# Patient Record
Sex: Female | Born: 1955
Health system: Southern US, Community
[De-identification: ages and names within clinical notes are randomized; demographics above are authoritative.]

## PROBLEM LIST (undated history)

## (undated) DIAGNOSIS — H269 Unspecified cataract: Secondary | ICD-10-CM

## (undated) DIAGNOSIS — I10 Essential (primary) hypertension: Secondary | ICD-10-CM

## (undated) DIAGNOSIS — K219 Gastro-esophageal reflux disease without esophagitis: Secondary | ICD-10-CM

## (undated) DIAGNOSIS — N920 Excessive and frequent menstruation with regular cycle: Secondary | ICD-10-CM

## (undated) DIAGNOSIS — E785 Hyperlipidemia, unspecified: Secondary | ICD-10-CM

## (undated) DIAGNOSIS — R011 Cardiac murmur, unspecified: Secondary | ICD-10-CM

## (undated) DIAGNOSIS — G7102 Facioscapulohumeral muscular dystrophy: Secondary | ICD-10-CM

## (undated) HISTORY — DX: Unspecified cataract: H26.9

## (undated) HISTORY — DX: Hyperlipidemia, unspecified: E78.5

## (undated) HISTORY — DX: Cardiac murmur, unspecified: R01.1

## (undated) HISTORY — DX: Facioscapulohumeral muscular dystrophy: G71.02

## (undated) HISTORY — DX: Gastro-esophageal reflux disease without esophagitis: K21.9

## (undated) HISTORY — PX: LASIK: SHX215

## (undated) HISTORY — DX: Excessive and frequent menstruation with regular cycle: N92.0

## (undated) HISTORY — PX: CATARACT EXTRACTION: SUR2

## (undated) HISTORY — PX: LASER ABLATION CONDYLOMA CERVICAL / VULVAR: SUR819

## (undated) HISTORY — PX: POLYPECTOMY: SHX149

## (undated) HISTORY — DX: Essential (primary) hypertension: I10

---

## 1998-10-30 ENCOUNTER — Other Ambulatory Visit: Admission: RE | Admit: 1998-10-30 | Discharge: 1998-10-30 | Payer: Self-pay | Admitting: Obstetrics and Gynecology

## 1999-11-18 ENCOUNTER — Other Ambulatory Visit: Admission: RE | Admit: 1999-11-18 | Discharge: 1999-11-18 | Payer: Self-pay | Admitting: Obstetrics and Gynecology

## 2000-11-18 ENCOUNTER — Encounter: Payer: Self-pay | Admitting: Obstetrics and Gynecology

## 2000-11-18 ENCOUNTER — Encounter: Admission: RE | Admit: 2000-11-18 | Discharge: 2000-11-18 | Payer: Self-pay | Admitting: Obstetrics and Gynecology

## 2001-02-03 ENCOUNTER — Other Ambulatory Visit: Admission: RE | Admit: 2001-02-03 | Discharge: 2001-02-03 | Payer: Self-pay | Admitting: Obstetrics and Gynecology

## 2002-02-09 ENCOUNTER — Other Ambulatory Visit: Admission: RE | Admit: 2002-02-09 | Discharge: 2002-02-09 | Payer: Self-pay | Admitting: Obstetrics and Gynecology

## 2004-05-06 ENCOUNTER — Encounter: Admission: RE | Admit: 2004-05-06 | Discharge: 2004-06-10 | Payer: Self-pay | Admitting: Internal Medicine

## 2004-06-23 ENCOUNTER — Encounter: Admission: RE | Admit: 2004-06-23 | Discharge: 2004-08-31 | Payer: Self-pay | Admitting: Orthopaedic Surgery

## 2006-03-22 HISTORY — PX: COLONOSCOPY: SHX174

## 2006-07-08 ENCOUNTER — Ambulatory Visit: Payer: Self-pay | Admitting: Internal Medicine

## 2006-07-15 ENCOUNTER — Encounter: Admission: RE | Admit: 2006-07-15 | Discharge: 2006-07-15 | Payer: Self-pay | Admitting: Obstetrics and Gynecology

## 2006-07-15 ENCOUNTER — Ambulatory Visit: Payer: Self-pay | Admitting: Internal Medicine

## 2006-07-15 LAB — CONVERTED CEMR LAB
ALT: 17 units/L (ref 0–40)
AST: 19 units/L (ref 0–37)
Alkaline Phosphatase: 48 units/L (ref 39–117)
CO2: 27 meq/L (ref 19–32)
Calcium: 8.8 mg/dL (ref 8.4–10.5)
Chloride: 110 meq/L (ref 96–112)
Free T4: 0.8 ng/dL (ref 0.6–1.6)
GFR calc non Af Amer: 94 mL/min
Glucose, Bld: 94 mg/dL (ref 70–99)
Hemoglobin: 8.6 g/dL — ABNORMAL LOW (ref 12.0–15.0)
Lymphocytes Relative: 25.9 % (ref 12.0–46.0)
MCV: 67.7 fL — ABNORMAL LOW (ref 78.0–100.0)
Monocytes Absolute: 0.7 10*3/uL (ref 0.2–0.7)
Neutrophils Relative %: 61.6 % (ref 43.0–77.0)
Platelets: 367 10*3/uL (ref 150–400)
Potassium: 4.1 meq/L (ref 3.5–5.1)
RDW: 15.7 % — ABNORMAL HIGH (ref 11.5–14.6)
Sodium: 140 meq/L (ref 135–145)
TSH: 1.73 microintl units/mL (ref 0.35–5.50)
Total Bilirubin: 0.6 mg/dL (ref 0.3–1.2)
WBC: 7 10*3/uL (ref 4.5–10.5)

## 2006-07-20 ENCOUNTER — Ambulatory Visit: Payer: Self-pay | Admitting: Internal Medicine

## 2006-07-20 LAB — CONVERTED CEMR LAB
Basophils Absolute: 0 10*3/uL (ref 0.0–0.1)
Basophils Relative: 0 % (ref 0.0–1.0)
Eosinophils Absolute: 0.2 10*3/uL (ref 0.0–0.6)
Folate: >20 ng/mL
HCT: 25.4 % — ABNORMAL LOW (ref 36.0–46.0)
Lymphocytes Relative: 27 % (ref 12.0–46.0)
MCV: 68.8 fL — ABNORMAL LOW (ref 78.0–100.0)
Monocytes Absolute: 0.3 10*3/uL (ref 0.2–0.7)
RDW: 16.7 % — ABNORMAL HIGH (ref 11.5–14.6)
WBC: 7.1 10*3/uL (ref 4.5–10.5)

## 2006-07-25 ENCOUNTER — Ambulatory Visit: Payer: Self-pay | Admitting: Internal Medicine

## 2006-07-25 LAB — CONVERTED CEMR LAB
HCT: 27.3 % — ABNORMAL LOW (ref 36.0–46.0)
Hemoglobin: 8.6 g/dL — ABNORMAL LOW (ref 12.0–15.0)
Lymphocytes Relative: 28.3 % (ref 12.0–46.0)
Monocytes Absolute: 0.5 10*3/uL (ref 0.2–0.7)
Platelets: 353 10*3/uL (ref 150–400)
RBC: 3.84 M/uL — ABNORMAL LOW (ref 3.87–5.11)
RDW: 17.9 % — ABNORMAL HIGH (ref 11.5–14.6)

## 2006-08-03 ENCOUNTER — Ambulatory Visit: Payer: Self-pay | Admitting: Internal Medicine

## 2006-08-10 ENCOUNTER — Ambulatory Visit: Payer: Self-pay | Admitting: Gastroenterology

## 2006-08-23 ENCOUNTER — Ambulatory Visit: Payer: Self-pay | Admitting: Gastroenterology

## 2006-08-23 ENCOUNTER — Encounter: Payer: Self-pay | Admitting: Gastroenterology

## 2006-08-23 ENCOUNTER — Encounter: Payer: Self-pay | Admitting: Internal Medicine

## 2006-10-27 ENCOUNTER — Ambulatory Visit (HOSPITAL_COMMUNITY): Admission: RE | Admit: 2006-10-27 | Discharge: 2006-10-27 | Payer: Self-pay | Admitting: Obstetrics and Gynecology

## 2006-10-27 ENCOUNTER — Encounter (INDEPENDENT_AMBULATORY_CARE_PROVIDER_SITE_OTHER): Payer: Self-pay | Admitting: Obstetrics and Gynecology

## 2009-03-22 HISTORY — PX: LUMBAR DISC SURGERY: SHX700

## 2009-05-27 ENCOUNTER — Ambulatory Visit: Payer: Self-pay | Admitting: Internal Medicine

## 2009-05-27 DIAGNOSIS — M5416 Radiculopathy, lumbar region: Secondary | ICD-10-CM | POA: Insufficient documentation

## 2009-05-27 DIAGNOSIS — M21372 Foot drop, left foot: Secondary | ICD-10-CM

## 2009-05-27 LAB — CONVERTED CEMR LAB
Bilirubin Urine: NEGATIVE
Blood in Urine, dipstick: NEGATIVE
Nitrite: NEGATIVE
Protein, U semiquant: NEGATIVE
Urobilinogen, UA: 0.2
WBC Urine, dipstick: NEGATIVE
pH: 7

## 2009-05-28 ENCOUNTER — Ambulatory Visit: Payer: Self-pay | Admitting: Internal Medicine

## 2009-06-26 ENCOUNTER — Encounter
Admission: RE | Admit: 2009-06-26 | Discharge: 2009-06-26 | Payer: Self-pay | Admitting: Physical Medicine and Rehabilitation

## 2009-07-03 ENCOUNTER — Encounter: Payer: Self-pay | Admitting: Internal Medicine

## 2009-08-20 ENCOUNTER — Encounter (INDEPENDENT_AMBULATORY_CARE_PROVIDER_SITE_OTHER): Payer: Self-pay | Admitting: Orthopedic Surgery

## 2009-08-20 ENCOUNTER — Observation Stay (HOSPITAL_COMMUNITY): Admission: RE | Admit: 2009-08-20 | Discharge: 2009-08-23 | Payer: Self-pay | Admitting: Orthopedic Surgery

## 2009-09-03 ENCOUNTER — Encounter: Payer: Self-pay | Admitting: Internal Medicine

## 2009-09-23 ENCOUNTER — Encounter: Admission: RE | Admit: 2009-09-23 | Discharge: 2009-09-23 | Payer: Self-pay | Admitting: Obstetrics and Gynecology

## 2009-11-03 ENCOUNTER — Encounter: Payer: Self-pay | Admitting: Internal Medicine

## 2009-12-02 ENCOUNTER — Encounter
Admission: RE | Admit: 2009-12-02 | Discharge: 2010-03-02 | Payer: Self-pay | Source: Home / Self Care | Attending: Orthopedic Surgery | Admitting: Orthopedic Surgery

## 2009-12-25 ENCOUNTER — Encounter: Payer: Self-pay | Admitting: Internal Medicine

## 2010-03-02 ENCOUNTER — Encounter
Admission: RE | Admit: 2010-03-02 | Discharge: 2010-03-19 | Payer: Self-pay | Source: Home / Self Care | Attending: Orthopedic Surgery | Admitting: Orthopedic Surgery

## 2010-03-17 ENCOUNTER — Encounter: Payer: Self-pay | Admitting: Internal Medicine

## 2010-03-19 ENCOUNTER — Encounter
Admission: RE | Admit: 2010-03-19 | Discharge: 2010-04-21 | Payer: Self-pay | Source: Home / Self Care | Attending: Orthopedic Surgery | Admitting: Orthopedic Surgery

## 2010-04-21 NOTE — Assessment & Plan Note (Signed)
Summary: BACKPAIN/KDC   Vital Signs:  Patient profile:   55 year old female Weight:      204 pounds Temp:     98.4 degrees F oral Resp:     15 per minute BP sitting:   124 / 72  (left arm)  Vitals Entered By: Doristine Devoid (May 27, 2009 3:20 PM) CC: L lower back pain x2 wks now radiating down hip and some tingling in foot   CC:  L lower back pain x2 wks now radiating down hip and some tingling in foot.  History of Present Illness: Onset 2 weeks ago w/o injury as constant  but  progressive , jabbing  L LS pain radiating to  L hip.This has caused her to  alter  gait ;with gait change she has developed pain in L shin & calf. Rx: NSAIDS help. PMH of chronic, intermittent R hip pain. PMH of  shoulder impingement due to bone spur L  as per Dr Rayburn Ma.  Steroid injection X 1 in 2008; surgery had been recommended but deferred by patient.  Review of Systems General:  Complains of fatigue; denies chills, fever, sweats, and weight loss. GI:  Complains of indigestion; denies bloody stools and dark tarry stools; Dyspepsia with NSAIDS for 2-3 days. GU:  Denies discharge, dysuria, and hematuria. MS:  Complains of joint pain, low back pain, and muscle weakness; denies joint redness, joint swelling, mid back pain, and thoracic pain; Shoulder arthritis with weakness LUE. Derm:  Denies lesion(s) and rash. Neuro:  Complains of numbness and tingling; denies brief paralysis and falling down; N&T L foot .  Physical Exam  General:  well-nourished,in no acute distress but mildly uncomfortable-appearing.   Eyes:  No corneal or conjunctival inflammation noted. Perrla.No icterus Abdomen:  Bowel sounds positive,abdomen soft and non-tender without masses, organomegaly or hernias noted. Msk:  Classic" low back crawl" up & down table Extremities:  No clubbing, cyanosis, edema, or deformity noted with normal full range of motion of all joints.  Neg SLR to 90 degrees . Limping on L  with ambulation Neurologic:   strength ? decreased L thigh to opposition; DTRs symmetrical and normal.  L foot  subjectively numb with flexion Skin:  Intact without suspicious lesions or rashes. No jaundice Psych:  memory intact for recent and remote, normally interactive, and good eye contact.     Impression & Recommendations:  Problem # 1:  LUMBAR RADICULOPATHY, LEFT (ICD-724.4) Assessment Comment Only  L2  , radiation to L hip , no sciatica. Neg SLR  Orders: Misc. Referral (Misc. Ref) T-Lumbar Spine Comp w/[facility] [phone])  Her updated medication list for this problem includes:    Tramadol Hcl 50 Mg Tabs (Tramadol hcl) .Marland Kitchen... 1 q 6 hrs as needed    Cyclobenzaprine Hcl 5 Mg Tabs (Cyclobenzaprine hcl) .Marland Kitchen... 1-2 at bedtime as needed  Problem # 2:  FOOT DROP, LEFT (ICD-736.79)  ? due to neural impingement @ ankle; clinically no disc rupture  Orders: Misc. Referral (Misc. Ref) T-Lumbar Spine Comp w/[facility] [id number])  Problem # 3:  GERD (ICD-530.81) Probable aggravation due to NSAIDS for #1 Her updated medication list for this problem includes:    Ranitidine Hcl 150 Mg Caps (Ranitidine hcl) .Marland Kitchen... Take one tab by mouth twice daily  Complete Medication List: 1)  Ranitidine Hcl 150 Mg Caps (Ranitidine hcl) .... Take one tab by mouth twice daily 2)  Tramadol Hcl 50 Mg Tabs (Tramadol hcl) .Marland Kitchen.. 1 q 6 hrs as needed 3)  Cyclobenzaprine Hcl 5 Mg Tabs (Cyclobenzaprine hcl) .Marland Kitchen.. 1-2 at bedtime as needed  Patient Instructions: 1)  Avoid foods high in acid (tomatoes, citrus juices, spicy foods). Avoid eating within two hours of lying down or before exercising. Do not over eat; try smaller more frequent meals. Elevate head of bed twelve inches when sleeping. Use  Tramadol in place of Naprosyn Prescriptions: CYCLOBENZAPRINE HCL 5 MG TABS (CYCLOBENZAPRINE HCL) 1-2 at bedtime as needed  #20 x 0   Entered and Authorized by:   Marga Melnick MD   Signed by:   Marga Melnick MD on 05/27/2009   Method used:   Faxed to  ...       [facility]  [address] [phone]* (retail)       [address]       [place], [place]  [address]       Ph: [phone]       Fax: [phone]   RxID:   [id number] TRAMADOL HCL 50 MG TABS (TRAMADOL HCL) 1 q 6 hrs as needed  #30 x 2   Entered and Authorized by:   Marga Melnick MD   Signed by:   Marga Melnick MD on 05/27/2009   Method used:   Faxed to ...       [facility]  [address] [phone]* (retail)       [address]       [place], [place]  [address]       Ph: [phone]       Fax: [phone]   RxID:   [id number]   Laboratory Results   Urine Tests    Routine Urinalysis   Glucose: negative   (Normal Range: Negative) Bilirubin: negative   (Normal Range: Negative) Ketone: negative   (Normal Range: Negative) Spec. Gravity: 1.010   (Normal Range: 1.003-1.035) Blood: negative   (Normal Range: Negative) pH: 7.0   (Normal Range: 5.0-8.0) Protein: negative   (Normal Range: Negative) Urobilinogen: 0.2   (Normal Range: 0-1) Nitrite: negative   (Normal Range: Negative) Leukocyte Esterace: negative   (Normal Range: Negative)

## 2010-04-21 NOTE — Letter (Signed)
Summary: [facility]  [facility]   Imported By: Lanelle Bal 11/07/2009 12:19:32  _____________________________________________________________________  External Attachment:    Type:   Image     Comment:   External Document

## 2010-04-21 NOTE — Consult Note (Signed)
Summary: [facility]  [facility]   Imported By: Lanelle Bal 07/23/2009 08:34:42  _____________________________________________________________________  External Attachment:    Type:   Image     Comment:   External Document

## 2010-04-21 NOTE — Consult Note (Signed)
Summary: [facility]  [facility]   Imported By: Lanelle Bal 10/01/2009 10:49:57  _____________________________________________________________________  External Attachment:    Type:   Image     Comment:   External Document

## 2010-04-21 NOTE — Consult Note (Signed)
Summary: [facility]  [facility]   Imported By: Lanelle Bal 01/06/2010 16:09:33  _____________________________________________________________________  External Attachment:    Type:   Image     Comment:   External Document

## 2010-04-23 NOTE — Letter (Signed)
Summary: [facility]   Imported By: Lanelle Bal 03/27/2010 10:54:07  _____________________________________________________________________  External Attachment:    Type:   Image     Comment:   External Document

## 2010-06-08 LAB — CBC
MCHC: 33.6 g/dL (ref 30.0–36.0)
RDW: 13.6 % (ref 11.5–15.5)

## 2010-06-08 LAB — PREGNANCY, URINE: Preg Test, Ur: NEGATIVE

## 2010-06-15 ENCOUNTER — Other Ambulatory Visit: Payer: Self-pay | Admitting: Dermatology

## 2010-08-04 NOTE — Op Note (Signed)
[name], [name] NO.:  [id number]   MEDICAL RECORD NO.:  [id number]          PATIENT TYPE:  AMB   LOCATION:  SDC                           FACILITY:  [facility]   PHYSICIAN:  Huel Cote, M.D. DATE OF BIRTH:  09-Mar-1956   DATE OF PROCEDURE:  10/27/2006  DATE OF DISCHARGE:                               OPERATIVE REPORT   PREOPERATIVE DIAGNOSES:  1. Menorrhagia.  2. Possible polyps.   POSTOPERATIVE DIAGNOSES:  1. Menorrhagia.  2. No polyps noted.   PROCEDURE:  1. Hysteroscopy.  2. Dilatation and curettage.  3. NovaSure endometrial ablation.   SURGEON:  Huel Cote, MD   ASSISTANT:  None.   ANESTHESIA:  LMA and a local 1% lidocaine block.   FINDINGS:  The uterine cavity was large with a thick endometrium noted,  but no dominant polyps were noted.  Endometrial curettings were obtained  and sent.   ESTIMATED BLOOD LOSS:  Minimal.   URINE OUTPUT:  Straight catheterized for approximately 50 mL of clear  urine prior to procedure.   HYSTEROSCOPIC DEFICIT:  Approximately 100 on the sorbitol and  approximately 50 on the LR.   FLUIDS:  Approximately 1400-mL LR.   FINDINGS:  The uterine cavity itself sounded to 10.5 to 11.  The cervix  was approximately 4 cm in length with a cavity length of 6.57; the width  was 4.5.  The power was 161.  Treatment time was 57 seconds.  Also the  cervix was noted to be very dilated from her Cytotec treatment and  really required no dilation whatsoever; because of this, it was somewhat  difficult to get a cervical seal and several tenaculums had to be used  to establish a good cervical seal to activate the device.   PROCEDURE:  The patient was taken to the operating room, where LMA  anesthesia was obtained without difficulty.  She was then prepped and  draped in normal sterile fashion in the dorsal lithotomy position.  A  speculum was placed within the vagina after it was sterilely prepped and  the cervix grasped  with a single-tooth tenaculum; it was also injected  with 1% lidocaine block, approximately 20 mL, at 2 and 10 o'clock for  additional postoperative comfort.  The cervix itself was measured with a  Hegar dilator and was found be 4 cm in length.  The uterine cavity, as  stated, sounded to 10.5 to 11, making a cavity length of approximately  6.5.  The small diagnostic scope was introduced into the uterine cavity;  however, no good distention could be obtained, as most of the fluid was  leaking back around the scope through the dilated cervix.  For this  reason, we changed to the normal-size resectoscope and got a better  seal, although still a fair amount of fluid leaked around behind the  camera.  The uterus was adequately distended to visualize that there  were no dominant polyps.  There was much fluffy proliferative  endometrium noted and for this reason, a good curettage was performed  and specimens sent to Pathology.  After the curettings, the solution was  changed to LR and the uterine cavity was rinsed with LR and the lines  cleared of sorbitol.  Once this was established, all was removed and the  NovaSure device was placed in the uterine cavity without difficulty.  At  this point, difficulty was had in obtaining a cervical seal and air  bubbles could be seen leaking around the cervical seal because of the  dilation of the patient's cervix; therefore, it was grasped and 2 extra  tenaculums placed around the os to close it adequately.  With this in  place, the tested then passed and I felt fully comfortable there was no  uterine perforation, as really the cervix had required no dilation  whatsoever.  The NovaSure device was activated and a treatment time a 57  seconds performed.  At the conclusion of the procedure, we removed all  instruments and sponges and the camera was reintroduced into the uterine  cavity.  The fundus was examined and found to have a good blanching over  at least  good 3/4 of the uterus and a little portion of the lower  uterine segment did appear to be still somewhat untreated; however, the  majority of the cavity had good blanching, there was no evidence of  perforation and all appeared well.  All instruments and sponges were  then carefully removed from the patient's vagina and one small area of  bleeding at the lower tenaculum site on the posterior lip of the cervix  was treated with silver nitrate with some improvement; however, one  suture of 3-0 Vicryl was placed in that area, as the tenaculum had torn  the cervix slightly and it was still bleeding.  There was good  hemostasis at this point and therefore the patient was awakened and  taken to the recovery room in stable condition.      Huel Cote, M.D.  Electronically Signed     KR/MEDQ  D:  10/27/2006  T:  10/27/2006  Job:  [id number]

## 2010-08-04 NOTE — Assessment & Plan Note (Signed)
[facility]                         GASTROENTEROLOGY OFFICE NOTE   Lisa [name], Lisa [name]                   MRN:          [id number]  DATE:08/10/2006                            DOB:          06-12-1955    REASON FOR CONSULTATION:  Iron deficiency anemia.   HISTORY OF PRESENT ILLNESS:  Lisa [name] is a pleasant 55 year old  white female referred through the courtesy of Dr. Alwyn Ren for evaluation.  Routine testing demonstrated a microcytic anemia.  On April 25,  hemoglobin was 8.6 and MCV was 67.7.  On May 5, hemoglobin was the same.  B12 and folate levels were normal.  She has been taking supplemental  iron.  Lisa [name] does report heavy menstrual periods.  She took a 6  day steroid Dosepak followed by about three days of nonsteroidals for  shoulder pain.  Prior to that, she was on no gastric irritants.  She  rarely sees blood on the toilet tissue with a bowel movement.  There is  no history of melena.  She does complain of occasional pyrosis.  Stool  hemoccults times three were recently negative.   PAST MEDICAL HISTORY:  Pertinent for arrhythmias.  She has arthritis.  She is status post C-section.   FAMILY HISTORY:  Noncontributory.   MEDICATIONS:  1. Chromagen.  2. Ranitidine.  3. Darvocet p.r.n.   ALLERGIES:  She has no allergies.   SOCIAL HISTORY:  She neither smokes nor drinks.  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   REVIEW OF SYSTEMS:  Positive for joint pains and back pain.   PHYSICAL EXAMINATION:  GENERAL:  She is a healthy appearing female.  VITAL SIGNS:  Pulse 80, blood pressure 118/80, weight 196.  HEENT: EOMI. PERRLA. Sclerae are anicteric.  Conjunctivae are pink.  NECK:  Supple without thyromegaly, adenopathy or carotid bruits.  CHEST:  Clear to auscultation and percussion without adventitious  sounds.  CARDIAC:  Regular rhythm; normal S1 S2.  There are no murmurs, gallops  or rubs.  ABDOMEN:  Bowel sounds are  normoactive.  Abdomen is soft, non-tender and  non-distended.  There are no abdominal masses, tenderness, splenic  enlargement or hepatomegaly.  EXTREMITIES:  Full range of motion.  No cyanosis, clubbing or edema.  RECTAL:  Deferred.   IMPRESSION:  Iron deficiency anemia.  I suspect this is due to her  menorrhagia.  It is unlikely that she has active peptic ulcer disease,  causing the chronic GI bleeding in as much as she was only taking  nonsteroidals for three days and the steroid Dosepak for six days.  A  chronic GI bleeding source must be considered including colon polyps and  neoplasm.  There is nothing to suggest an acute or intermittent GI  bleed.   RECOMMENDATION:  Colonoscopy.  If negative, I would not pursue her GI  workup any further at this time but follow her hemoccults periodically.     Barbette Hair. Arlyce Dice, MD,FACG  Electronically Signed    RDK/MedQ  DD: 08/10/2006  DT: 08/10/2006  Job #: [id number]   cc:   Titus Dubin. Alwyn Ren, MD,FACP,FCCP

## 2010-08-04 NOTE — Assessment & Plan Note (Signed)
[facility]                        [place] OFFICE NOTE   TYRIKA, NEWMAN                   MRN:          [id number]  DATE:07/20/2006                            DOB:          1956/03/04    Lisa [name] was seen in followup July 20, 2006 for anemia.  Her  hematocrit was 26.8 on July 15, 2006.  This is in the context of  nonsteroidals taken for hip pain as well as for her shoulder syndrome,  and for which surgery may be necessary.  Additionally, she relates  history of heavy menses with periods lasting up to 7 days with at least  3 or 4 days of heavy flow.  She had not been on iron supplement.She has  noted some dark stools after starting iron.  She has been on ranitidine  150 mg twice a day with dramatic improvement in her dyspepsia.  She  denies any dysphagia.  She has had rectal bleeding on average 10 of the  last 60 days, enough to discolor the toilet water.   There is no personal or family history of ulcers, colitis, colon polyps,  or colon cancer.  She has not had a screening colonoscopy.   EXAMINATION:  She does have some pallor of the conjunctivae.  She has no  lymphadenopathy or organomegaly.  ABDOMEN:  Non-tender, although she has some discomfort if she is having  her menses.   Repeat CBC will be collected along with serum iron, iron binding  capacity, folate, and B12 levels.   She should stay on the Chromagen, and have repeat CBC in 4 weeks if  there is not a dramatic drop in her present hematocrit from July 15, 2006.  Additionally, I feel that GI consultation is indicated to rule  out any significant gastroenterologic bleeding.  The most likely cause  of her significant anemia is nonsteroidals and the heavy menses in the  absence of iron supplementation.   A copy of this will be sent to GI requesting consultation.  She has been  asked to defer in the orthopedic surgery until the anemia can be  evaluated, and  the hematocrit is at least low normal.     Titus Dubin. Alwyn Ren, MD,FACP,FCCP  Electronically Signed    WFH/MedQ  DD: 07/20/2006  DT: 07/20/2006  Job #: [id number]   cc:   Vanita Panda. Magnus Ivan, M.D.

## 2010-08-04 NOTE — H&P (Signed)
NAMEDARSI, TIEN NO.:  [id number]   MEDICAL RECORD NO.:  [id number]          PATIENT TYPE:  AMB   LOCATION:  SDC                           FACILITY:  [facility]   PHYSICIAN:  Huel Cote, M.D. DATE OF BIRTH:  Oct 08, 1955   DATE OF ADMISSION:  10/27/2006  DATE OF DISCHARGE:                              HISTORY & PHYSICAL   HISTORY OF PRESENT ILLNESS:  The patient is a 55 year old, G2, P2, who  is coming in for a complaint of menorrhagia and some resulting anemia  which responded to iron therapy.  The patient's periods are  approximately every 23 to 28 days and last for a full 7 days with 3 to 4  of those being very heavy with flooding and clotting and cramping.  The  patient's husband has had a vasectomy, and she wishes to improve her  menorrhagia definitively.   PAST MEDICAL HISTORY:  1. Anemia.  2. Reflux disease.  3. Arthritis.   PAST SURGICAL HISTORY:  Cesarean section x2.   PAST GYN HISTORY:  No abnormal Pap smears.   PAST OBSTETRICAL HISTORY:  Cesarean section x2 as stated.   FAMILY HISTORY:  No breast cancer, colon cancer or heart disease.   MEDICATIONS:  1. The patient is on iron since approximately mid April.  2. Zantac.  3. Multivitamin.  4. Fish oil.   ALLERGIES:  No known drug allergies.   PHYSICAL EXAMINATION:  VITAL SIGNS:  Her blood pressure is 128/90,  weight is 190 pounds.  BREAST EXAM:  Normal with no discharge, adenopathy or masses noted.  CARDIAC:  Regular rate and rhythm.  LUNGS:  Clear.  ABDOMEN:  Soft and nontender.  PELVIC:  She has normal external genitalia noted.  Cervix has not  lesions.  Uterus is normal in size.  Adnexa have no masses.   The patient did undergo a saline infusion ultrasound which revealed  probable 2 endometrial polyps and a thickened endometrium.  We discussed  the options of hysteroscopy and D&C with a NovaSure ablation, and the  patient desired to proceed.  She was having no intermenstrual  bleeding,  and for that reason will undergo endometrial  sampling at the time of NovaSure.  The risks and benefits of the surgery  were discussed with the patient in detail including bleeding and uterine  perforation.  She understands these risks and desires to proceed with  the surgery as stated.      Huel Cote, M.D.  Electronically Signed     KR/MEDQ  D:  10/26/2006  T:  10/26/2006  Job:  [id number]

## 2010-08-07 NOTE — Assessment & Plan Note (Signed)
[facility]                        [place] OFFICE NOTE   Lisa, [name]                   MRN:          [id number]  DATE:07/08/2006                            DOB:          1956/02/01    Lisa [name] was seen as a new patient  for comprehensive exam on  July 08, 2006.   Her major issues at this time are orthopedic.  Specifically, she has a  bone spur of the left shoulder for which she had an intraarticular  steroid injection in 2006.  Surgery is planned because of the chronic  pain and limitation in range of motion.   Additionally, she has arthritis in the right hip for which she is now  taking methylprednisolone 4 mg Pak.  It is planned that she will  initiate a nonsteroidal after completion of the methylprednisolone.  She  is followed by Dr. Doneen Poisson, orthopedist.   Her past medical history reveals two pregnancies and two deliveries by  cesarean section.   Family history includes thyroid cancer, hypertension, valve replacement  in her mother, prostate cancer and stenting in her father, diabetes in  paternal uncles, and diabetes and coronary artery disease in her  paternal grandmother.   She has never smoked and does not drink.  She has no known drug  allergies.  She is on no regular exercise program and is on no specific  diet.   Review of systems reveals dyspnea, which she relates to deconditioning.  She has also had fatigue.   She has rectal bleeding related to hemorrhoids.   She has horrible heartburn for which she takes Tums.   She has been on Aleve in the past.  She also drinks a large cup of  unsweetened tea daily, as well as one diet cola.   She is premenopausal; she is on no calcium or vitamin D.   She is 5 feet 7 inches and weighs 193.6 fully clothed.  Pulse is 64.  Respiratory rate is 15.  Blood pressure was 128/66.   The fundi are difficult to visualize due to the accommodation  required.  Nares are patent.  Dental hygiene is excellent.  Otolaryngologic exam is  otherwise unremarkable.   She has a grade 1/2 to 1 systolic murmur across the precordium.  A left  carotid bruit is suggested.   Thyroid is normal to palpation.  Chest is clear.   There is no organomegaly or masses.   There is no pulse deficit in the radial arteries and all pulses are  intact.  She has no edema.   She does have crepitus to the knees.  She has pain with passive rotation  of the right hip, has limited range of motion in the left shoulder.   Additional history reveals that her cholesterol was 270 in 2003 by  memory.  She is unsure of any hyperglycemia.   The NMR LipoProfile was thought to be necessary because of her family  history, her personal history of dyslipidemia, and the carotid bruit.  A  carotid Doppler may also be indicated.   She has significant reflux and ranitidine 150 mg  twice a day would be  recommended.  The list of triggers was provided to her.  I have asked  her to discuss her  GI symptoms with Dr. Magnus Ivan.  Perhaps, something  such as Celebrex or generic Mobic would be less irritating to the  stomach.   It would be recommended that she take 1000 mg of calcium a day and 1000  international units of vitamin D daily.  As soon as her orthopedic  situation allows, it is recommended that she walk 30 minutes three times  a week.  In the meantime, the patient was advised that water aerobics  might be an option if her hip issues allow.   She is concerned about weight gain and I recommended that she visit  [email or web address] for the Flat Belly Diet, which is low carb and heart  healthy.   I will meet with Chaia once we have these data to optimally assess for  risks and options.     Titus Dubin. Alwyn Ren, MD,FACP,FCCP  Electronically Signed    WFH/MedQ  DD: 07/08/2006  DT: 07/09/2006  Job #: [id number]

## 2010-09-01 ENCOUNTER — Ambulatory Visit: Payer: 59 | Attending: Neurology | Admitting: Physical Therapy

## 2010-09-01 DIAGNOSIS — M545 Low back pain, unspecified: Secondary | ICD-10-CM | POA: Insufficient documentation

## 2010-09-01 DIAGNOSIS — IMO0001 Reserved for inherently not codable concepts without codable children: Secondary | ICD-10-CM | POA: Insufficient documentation

## 2010-09-01 DIAGNOSIS — R262 Difficulty in walking, not elsewhere classified: Secondary | ICD-10-CM | POA: Insufficient documentation

## 2010-09-01 DIAGNOSIS — M2569 Stiffness of other specified joint, not elsewhere classified: Secondary | ICD-10-CM | POA: Insufficient documentation

## 2010-09-07 ENCOUNTER — Ambulatory Visit: Payer: 59 | Admitting: Physical Therapy

## 2010-09-10 ENCOUNTER — Ambulatory Visit: Payer: 59 | Admitting: Physical Therapy

## 2010-09-14 ENCOUNTER — Ambulatory Visit: Payer: 59 | Admitting: Physical Therapy

## 2010-09-17 ENCOUNTER — Ambulatory Visit: Payer: 59 | Admitting: Physical Therapy

## 2010-09-21 ENCOUNTER — Ambulatory Visit: Payer: 59 | Attending: Neurology | Admitting: Physical Therapy

## 2010-09-21 DIAGNOSIS — M545 Low back pain, unspecified: Secondary | ICD-10-CM | POA: Insufficient documentation

## 2010-09-21 DIAGNOSIS — IMO0001 Reserved for inherently not codable concepts without codable children: Secondary | ICD-10-CM | POA: Insufficient documentation

## 2010-09-21 DIAGNOSIS — M2569 Stiffness of other specified joint, not elsewhere classified: Secondary | ICD-10-CM | POA: Insufficient documentation

## 2010-09-21 DIAGNOSIS — R262 Difficulty in walking, not elsewhere classified: Secondary | ICD-10-CM | POA: Insufficient documentation

## 2010-09-29 ENCOUNTER — Ambulatory Visit: Payer: 59 | Admitting: Physical Therapy

## 2010-10-01 ENCOUNTER — Ambulatory Visit: Payer: 59 | Admitting: Physical Therapy

## 2010-10-05 ENCOUNTER — Ambulatory Visit: Payer: 59 | Admitting: Physical Therapy

## 2010-10-08 ENCOUNTER — Ambulatory Visit: Payer: 59 | Admitting: Physical Therapy

## 2010-10-12 ENCOUNTER — Ambulatory Visit: Payer: 59 | Admitting: Physical Therapy

## 2010-10-14 ENCOUNTER — Ambulatory Visit: Payer: 59 | Admitting: Physical Therapy

## 2010-10-19 ENCOUNTER — Ambulatory Visit: Payer: 59 | Admitting: Physical Therapy

## 2010-10-22 ENCOUNTER — Ambulatory Visit: Payer: 59 | Attending: Neurology | Admitting: Physical Therapy

## 2010-10-22 DIAGNOSIS — IMO0001 Reserved for inherently not codable concepts without codable children: Secondary | ICD-10-CM | POA: Insufficient documentation

## 2010-10-22 DIAGNOSIS — M2569 Stiffness of other specified joint, not elsewhere classified: Secondary | ICD-10-CM | POA: Insufficient documentation

## 2010-10-22 DIAGNOSIS — M545 Low back pain, unspecified: Secondary | ICD-10-CM | POA: Insufficient documentation

## 2010-10-22 DIAGNOSIS — R262 Difficulty in walking, not elsewhere classified: Secondary | ICD-10-CM | POA: Insufficient documentation

## 2010-10-26 ENCOUNTER — Ambulatory Visit: Payer: 59 | Admitting: Physical Therapy

## 2010-10-28 ENCOUNTER — Ambulatory Visit: Payer: 59 | Admitting: Physical Therapy

## 2010-11-09 ENCOUNTER — Ambulatory Visit: Payer: 59 | Admitting: Physical Therapy

## 2010-11-11 ENCOUNTER — Ambulatory Visit: Payer: 59 | Admitting: Physical Therapy

## 2010-11-16 ENCOUNTER — Ambulatory Visit: Payer: 59 | Admitting: Physical Therapy

## 2010-11-19 ENCOUNTER — Ambulatory Visit: Payer: 59 | Admitting: Physical Therapy

## 2010-11-24 ENCOUNTER — Ambulatory Visit: Payer: 59 | Attending: Neurology | Admitting: Physical Therapy

## 2010-11-24 DIAGNOSIS — IMO0001 Reserved for inherently not codable concepts without codable children: Secondary | ICD-10-CM | POA: Insufficient documentation

## 2010-11-24 DIAGNOSIS — M545 Low back pain, unspecified: Secondary | ICD-10-CM | POA: Insufficient documentation

## 2010-11-24 DIAGNOSIS — M2569 Stiffness of other specified joint, not elsewhere classified: Secondary | ICD-10-CM | POA: Insufficient documentation

## 2010-11-24 DIAGNOSIS — R262 Difficulty in walking, not elsewhere classified: Secondary | ICD-10-CM | POA: Insufficient documentation

## 2010-11-26 ENCOUNTER — Ambulatory Visit: Payer: 59 | Admitting: Physical Therapy

## 2011-01-04 LAB — CBC
MCHC: 33.3
MCV: 82.8

## 2011-07-06 ENCOUNTER — Encounter: Payer: Self-pay | Admitting: Gastroenterology

## 2011-10-28 ENCOUNTER — Ambulatory Visit: Payer: 59

## 2011-10-29 ENCOUNTER — Ambulatory Visit: Payer: 59

## 2011-11-08 ENCOUNTER — Ambulatory Visit (INDEPENDENT_AMBULATORY_CARE_PROVIDER_SITE_OTHER): Payer: 59

## 2011-11-08 DIAGNOSIS — Z111 Encounter for screening for respiratory tuberculosis: Secondary | ICD-10-CM

## 2011-11-10 ENCOUNTER — Encounter: Payer: Self-pay | Admitting: *Deleted

## 2011-11-10 ENCOUNTER — Ambulatory Visit: Payer: 59

## 2011-11-10 LAB — TB SKIN TEST: Induration: 0 mm

## 2011-12-31 ENCOUNTER — Encounter: Payer: Self-pay | Admitting: Internal Medicine

## 2011-12-31 ENCOUNTER — Ambulatory Visit (INDEPENDENT_AMBULATORY_CARE_PROVIDER_SITE_OTHER): Payer: 59 | Admitting: Internal Medicine

## 2011-12-31 VITALS — BP 132/86 | HR 81 | Temp 97.7°F | Resp 16 | Ht 67.5 in | Wt 213.0 lb

## 2011-12-31 DIAGNOSIS — N926 Irregular menstruation, unspecified: Secondary | ICD-10-CM

## 2011-12-31 DIAGNOSIS — R5383 Other fatigue: Secondary | ICD-10-CM

## 2011-12-31 DIAGNOSIS — R0600 Dyspnea, unspecified: Secondary | ICD-10-CM

## 2011-12-31 DIAGNOSIS — R0609 Other forms of dyspnea: Secondary | ICD-10-CM

## 2011-12-31 DIAGNOSIS — Z0000 Encounter for general adult medical examination without abnormal findings: Secondary | ICD-10-CM

## 2011-12-31 DIAGNOSIS — M2550 Pain in unspecified joint: Secondary | ICD-10-CM

## 2011-12-31 DIAGNOSIS — R5381 Other malaise: Secondary | ICD-10-CM

## 2011-12-31 DIAGNOSIS — R0989 Other specified symptoms and signs involving the circulatory and respiratory systems: Secondary | ICD-10-CM

## 2011-12-31 NOTE — Patient Instructions (Addendum)
Preventive Health Care: Eat a low-fat diet with lots of fruits and vegetables, up to 7-9 servings per day.  Consume less than 30 grams of sugar per day from foods & drinks with High Fructose Corn Syrup as #1,2,3 or #4 on label. Health Care Power of Attorney & Living Will place you in charge of your health care  decisions. Verify these are  in place. Please  schedule fasting Labs : BMET,Lipids, hepatic panel, CBC & dif, TSH, RA factor,sed rate. PLEASE BRING THESE INSTRUCTIONS TO FOLLOW UP  LAB APPOINTMENT.This will guarantee correct labs are drawn, eliminating need for repeat blood sampling ( needle sticks ! ). Diagnoses /Codes: V70.0.  If you activate My Chart; the results can be released to you as soon as they populate from the lab. If you choose not to use this program; the labs have to be reviewed, copied & mailed   causing a delay in getting the results to you.

## 2011-12-31 NOTE — Progress Notes (Signed)
Subjective:    Patient ID: Lisa [name], female    DOB: 04/04/1955, 56 y.o.   MRN: [id number]  HPI  She  is here for a physical;acute issues include dyspnea & fatigue for 6 months.      Review of Systems Despite the lumbar disc surgery in 2011; she still has heaviness her legs and has persistent foot drop. Tests were completed by neurologists; she was told that the nerve damage is permanent. There is no improvement following physical therapy. She states she feels as if  she is "dragging" herself around all time. In addition she describes generalized fatigue and dyspnea which has been present for 6 months. This has been worse the last several weeks. 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 She has a past history of reflux; she is not on medications for reflux. She does describe some dysphagia over the upper chest area. She denies unexplained weight loss, melena, rectal bleeding. She's had some decrease in visual acuity attributed to cataracts. She denies double vision or loss of vision. She has had no associated skin, hair, or nail changes. She does have constipation. Blood studies were done at her gynecologist's office last 1-2  months. She states her thyroid was normal and she was not anemic. They also checked her hormone levels to assess the menopausal status.  She also describes pain in elbows and joints of the right hand. She's been taking 2 Aleve as needed; Celebrex was of no benefit. She's concerned because his family history of rheumatoid arthritis in her mother's side of the family    Objective:   Physical Exam Gen.: Healthy and well-nourished in appearance. Alert, appropriate and cooperative throughout exam. Head: Normocephalic without obvious abnormalities  Eyes: No corneal or conjunctival inflammation noted. Pupils equal round reactive to light and accommodation. Fundal exam is benign without hemorrhages, exudate, papilledema. Extraocular motion  intact. Vision grossly normal with lenses. Ears: External  ear exam reveals no significant lesions or deformities. Canals clear .TMs normal. Hearing is grossly normal bilaterally. Nose: External nasal exam reveals no deformity or inflammation. Nasal mucosa are pink and moist. No lesions or exudates noted.   Mouth: Oral mucosa and oropharynx reveal no lesions or exudates. Teeth in good repair. Neck: No deformities, masses, or tenderness noted. Range of motion & Thyroid normal Lungs: Normal respiratory effort; chest expands symmetrically. Lungs are clear to auscultation without rales, wheezes, or increased work of breathing. Heart: Normal rate and rhythm. Normal S1 and S2. No gallop, click, or rub. Grade 1/2 over 6 systolic murmur R base  Abdomen: Bowel sounds normal; abdomen soft and nontender. No masses, organomegaly or hernias noted. Genitalia: Dr Senaida Ores, Gyn                                                          Musculoskeletal/extremities: No deformity or scoliosis noted of  the thoracic or lumbar spine. No clubbing, cyanosis, edema, or deformity noted. Range of motion  normal .Tone & strength  normal.Joints normal. Nail health  good. Vascular: Carotid, radial artery, dorsalis pedis and  posterior tibial pulses are full and equal. L carotid present. Neurologic: Alert and oriented x3. Deep tendon reflexes symmetrical and normal.          Skin: Intact without suspicious lesions or rashes.  Lymph: No cervical, axillary lymphadenopathy present. Psych: Mood and affect are normal. Normally interactive                                                                                         Assessment & Plan:  #1 comprehensive physical exam; no acute findings #2 fatigue #3 dyspnea progressive; #2 and 3 on the context of heavy, irregular menses #4 arthralgias #5 left carotid bruit; carotid Doppler and lipid assessment indicated Plan: see Orders   EKG reveals right bundle branch block; no  ischemic changes are present

## 2012-01-03 ENCOUNTER — Other Ambulatory Visit (INDEPENDENT_AMBULATORY_CARE_PROVIDER_SITE_OTHER): Payer: 59

## 2012-01-03 DIAGNOSIS — Z0000 Encounter for general adult medical examination without abnormal findings: Secondary | ICD-10-CM

## 2012-01-03 LAB — HEPATIC FUNCTION PANEL
ALT: 12 U/L (ref 0–35)
AST: 15 U/L (ref 0–37)
Albumin: 3.7 g/dL (ref 3.5–5.2)
Alkaline Phosphatase: 51 U/L (ref 39–117)
Total Bilirubin: 0.6 mg/dL (ref 0.3–1.2)
Total Protein: 7.1 g/dL (ref 6.0–8.3)

## 2012-01-03 LAB — BASIC METABOLIC PANEL
BUN: 13 mg/dL (ref 6–23)
CO2: 21 mEq/L (ref 19–32)
Calcium: 8.6 mg/dL (ref 8.4–10.5)
Creatinine, Ser: 0.6 mg/dL (ref 0.4–1.2)
GFR: 101.73 mL/min (ref >60.00)
Glucose, Bld: 92 mg/dL (ref 70–99)
Potassium: 4.1 mEq/L (ref 3.5–5.1)
Sodium: 139 mEq/L (ref 135–145)

## 2012-01-03 LAB — CBC WITH DIFFERENTIAL/PLATELET
Basophils Absolute: 0.1 10*3/uL (ref 0.0–0.1)
HCT: 32 % — ABNORMAL LOW (ref 36.0–46.0)
Hemoglobin: 10.2 g/dL — ABNORMAL LOW (ref 12.0–15.0)
Lymphs Abs: 1.9 10*3/uL (ref 0.7–4.0)
Monocytes Relative: 7.5 % (ref 3.0–12.0)
Neutrophils Relative %: 64.2 % (ref 43.0–77.0)
RBC: 3.81 Mil/uL — ABNORMAL LOW (ref 3.87–5.11)
WBC: 7.7 10*3/uL (ref 4.5–10.5)

## 2012-01-03 LAB — TSH: TSH: 3.47 u[IU]/mL (ref 0.35–5.50)

## 2012-01-03 LAB — LDL CHOLESTEROL, DIRECT: Direct LDL: 217.3 mg/dL

## 2012-01-03 LAB — LIPID PANEL: Cholesterol: 258 mg/dL — ABNORMAL HIGH (ref 0–200)

## 2012-01-04 ENCOUNTER — Other Ambulatory Visit: Payer: Self-pay | Admitting: Cardiology

## 2012-01-04 DIAGNOSIS — R0989 Other specified symptoms and signs involving the circulatory and respiratory systems: Secondary | ICD-10-CM

## 2012-01-06 ENCOUNTER — Encounter (INDEPENDENT_AMBULATORY_CARE_PROVIDER_SITE_OTHER): Payer: 59

## 2012-01-06 DIAGNOSIS — R0989 Other specified symptoms and signs involving the circulatory and respiratory systems: Secondary | ICD-10-CM

## 2012-01-06 DIAGNOSIS — I6529 Occlusion and stenosis of unspecified carotid artery: Secondary | ICD-10-CM

## 2012-05-06 ENCOUNTER — Other Ambulatory Visit: Payer: Self-pay

## 2012-06-28 ENCOUNTER — Encounter: Payer: Self-pay | Admitting: Gastroenterology

## 2013-01-25 ENCOUNTER — Other Ambulatory Visit: Payer: Self-pay

## 2014-01-04 ENCOUNTER — Other Ambulatory Visit: Payer: Self-pay

## 2014-12-19 ENCOUNTER — Encounter: Payer: Self-pay | Admitting: Internal Medicine

## 2014-12-19 ENCOUNTER — Ambulatory Visit (INDEPENDENT_AMBULATORY_CARE_PROVIDER_SITE_OTHER): Payer: Commercial Managed Care - HMO | Admitting: Internal Medicine

## 2014-12-19 ENCOUNTER — Other Ambulatory Visit (INDEPENDENT_AMBULATORY_CARE_PROVIDER_SITE_OTHER): Payer: Commercial Managed Care - HMO

## 2014-12-19 VITALS — BP 150/100 | HR 76 | Temp 98.0°F | Resp 18 | Wt 227.0 lb

## 2014-12-19 DIAGNOSIS — E785 Hyperlipidemia, unspecified: Secondary | ICD-10-CM | POA: Diagnosis not present

## 2014-12-19 DIAGNOSIS — R739 Hyperglycemia, unspecified: Secondary | ICD-10-CM

## 2014-12-19 DIAGNOSIS — I10 Essential (primary) hypertension: Secondary | ICD-10-CM

## 2014-12-19 DIAGNOSIS — Z860100 Personal history of colon polyps, unspecified: Secondary | ICD-10-CM

## 2014-12-19 LAB — LIPID PANEL
CHOL/HDL RATIO: 8
Cholesterol: 299 mg/dL — ABNORMAL HIGH (ref 0–200)
HDL: 39.3 mg/dL (ref >39.00)
NONHDL: 259.58
TRIGLYCERIDES: 225 mg/dL — AB (ref 0.0–149.0)
VLDL: 45 mg/dL — AB (ref 0.0–40.0)

## 2014-12-19 LAB — BASIC METABOLIC PANEL
BUN: 13 mg/dL (ref 6–23)
CALCIUM: 10 mg/dL (ref 8.4–10.5)
CHLORIDE: 104 meq/L (ref 96–112)
CO2: 29 meq/L (ref 19–32)
CREATININE: 0.67 mg/dL (ref 0.40–1.20)
GFR: 95.5 mL/min (ref >60.00)
Glucose, Bld: 109 mg/dL — ABNORMAL HIGH (ref 70–99)
Potassium: 4.1 mEq/L (ref 3.5–5.1)
Sodium: 140 mEq/L (ref 135–145)

## 2014-12-19 LAB — CBC WITH DIFFERENTIAL/PLATELET
BASOS ABS: 0 10*3/uL (ref 0.0–0.1)
BASOS PCT: 0.2 % (ref 0.0–3.0)
EOS ABS: 0.2 10*3/uL (ref 0.0–0.7)
Eosinophils Relative: 1.9 % (ref 0.0–5.0)
HCT: 44.2 % (ref 36.0–46.0)
Hemoglobin: 14.7 g/dL (ref 12.0–15.0)
LYMPHS ABS: 1.9 10*3/uL (ref 0.7–4.0)
LYMPHS PCT: 24.4 % (ref 12.0–46.0)
MCHC: 33.3 g/dL (ref 30.0–36.0)
MCV: 89.4 fl (ref 78.0–100.0)
MONO ABS: 0.6 10*3/uL (ref 0.1–1.0)
Monocytes Relative: 7.8 % (ref 3.0–12.0)
NEUTROS ABS: 5.2 10*3/uL (ref 1.4–7.7)
NEUTROS PCT: 65.7 % (ref 43.0–77.0)
PLATELETS: 244 10*3/uL (ref 150.0–400.0)
RBC: 4.94 Mil/uL (ref 3.87–5.11)
RDW: 13.7 % (ref 11.5–15.5)
WBC: 7.9 10*3/uL (ref 4.0–10.5)

## 2014-12-19 LAB — TSH: TSH: 2.12 u[IU]/mL (ref 0.35–4.50)

## 2014-12-19 LAB — HEPATIC FUNCTION PANEL
ALT: 24 U/L (ref 0–35)
AST: 20 U/L (ref 0–37)
Albumin: 4 g/dL (ref 3.5–5.2)
Alkaline Phosphatase: 68 U/L (ref 39–117)
BILIRUBIN DIRECT: 0.1 mg/dL (ref 0.0–0.3)
BILIRUBIN TOTAL: 0.4 mg/dL (ref 0.2–1.2)
TOTAL PROTEIN: 7.3 g/dL (ref 6.0–8.3)

## 2014-12-19 LAB — LDL CHOLESTEROL, DIRECT: Direct LDL: 240 mg/dL

## 2014-12-19 LAB — HEMOGLOBIN A1C: Hgb A1c MFr Bld: 5.6 % (ref 4.6–6.5)

## 2014-12-19 MED ORDER — LISINOPRIL-HYDROCHLOROTHIAZIDE 10-12.5 MG PO TABS: 1.0000 | ORAL_TABLET | Freq: Every day | ORAL | Status: DC

## 2014-12-19 NOTE — Progress Notes (Signed)
Pre visit review using our clinic review tool, if applicable. No additional management support is needed unless otherwise documented below in the visit note. 

## 2014-12-19 NOTE — Patient Instructions (Addendum)
Minimal Blood Pressure Goal= AVERAGE < 140/90;  Ideal is an AVERAGE < 135/85. This AVERAGE should be calculated from @ least 5-7 BP readings taken @ different times of day on different days of week. You should not respond to isolated BP readings , but rather the AVERAGE for that week  .Please bring your  blood pressure cuff to office visits to verify that it is reliable.It  can also be checked against the blood pressure device at the pharmacy. Finger or wrist cuffs are not dependable; an arm cuff is.  Please verify schedule follow up colonoscopy with Dr Deatra Ina.  Your next office appointment will be determined based upon review of your pending labs  and  xrays  Those written interpretation of the lab results and instructions will be transmitted to you by My Chart  Critical results will be called.   Followup as needed for any active or acute issue. Please report any significant change in your symptoms.

## 2014-12-19 NOTE — Progress Notes (Signed)
   Subjective:    Patient ID: Lisa [name], female    DOB: 1955/10/10, 59 y.o.   MRN: [id number]  HPI   She is here at the urging of her Gynecologist. 2 weeks ago her blood pressure was found to be 170/120 with 160/110 on recheck. She is asymptomatic in reference to this.  She eats red meats, fried foods, and salt liberally. She is not exercising. She is not a smoker or drinker.  She's had dyslipidemia in the past; in 2013 her LDL was 217.3.  There is a family history of stroke in her grandfather & heart attack in her grandmother.  At the Gynecologist she was found to have mild hyperglycemia but she states she was not fasting. Her father and paternal grandmother have diabetes.  Her last colonoscopy was in 2008; tubular adenoma was found. She realized that repeat was recommended in 2013. Her only GI symptoms include some hoarseness in the morning as well as occasional dysphagia which requires she "chew food well ".  Major issues relate to arthritis and degenerative disc disease. She has a history of rotator cuff issues and shoulder impingement with pain in the left shoulder.  She had a L5-S1 disc surgery by Dr. Collier Salina in 2011. She's had residual foot drop on the left since.  She has pending ophthalmologic surgery for a dense floater. She had cataract surgery in 2014.    Review of Systems  Chest pain, palpitations, tachycardia, exertional dyspnea, paroxysmal nocturnal dyspnea, claudication or edema are absent. No unexplained weight loss, abdominal pain, significant dyspepsia,  melena, rectal bleeding, or persistently small caliber stools. Dysuria, pyuria, hematuria, frequency, nocturia or polyuria are denied. Change in hair, skin, nails denied. No bowel changes of constipation or diarrhea. No intolerance to heat or cold.     Objective:   Physical Exam Pertinent or positive findings include: There is decreased light reflex on the left. Arterial narrowing is noted in the  right fundus. Breath sounds are decreased. There is accentuation of the upper thoracic curvature. She has crepitus in the knees. There are fusiform changes of the knees.   General appearance :adequately nourished; in no distress.  Eyes: No conjunctival inflammation or scleral icterus is present.  Oral exam:  Lips and gums are healthy appearing.There is no oropharyngeal erythema or exudate noted. Dental hygiene is good.  Heart:  Normal rate and regular rhythm. S1 and S2 normal without gallop, murmur, click, rub or other extra sounds    Lungs:Chest clear to auscultation; no wheezes, rhonchi,rales ,or rubs present.No increased work of breathing.   Abdomen: bowel sounds normal, soft and non-tender without masses, organomegaly or hernias noted.  No guarding or rebound. No flank tenderness to percussion.  Vascular : all pulses equal ; no bruits present.  Skin:Warm & dry.  Intact without suspicious lesions or rashes ; no tenting or jaundice   Lymphatic: No lymphadenopathy is noted about the head, neck, axilla, or inguinal areas.   Neuro: Strength, tone & DTRs normal.        Assessment & Plan:  #1 hypertension  #2 dyslipidemia  #3 tubular adenoma; no follow-up to date.  Plan: See orders and recommendations

## 2014-12-31 ENCOUNTER — Encounter: Payer: Self-pay | Admitting: Gastroenterology

## 2015-01-21 HISTORY — PX: VITRECTOMY: SHX106

## 2015-02-18 ENCOUNTER — Other Ambulatory Visit: Payer: Self-pay | Admitting: Internal Medicine

## 2015-02-19 ENCOUNTER — Ambulatory Visit (AMBULATORY_SURGERY_CENTER): Payer: Self-pay | Admitting: *Deleted

## 2015-02-19 VITALS — Ht 67.0 in | Wt 224.0 lb

## 2015-02-19 DIAGNOSIS — Z86010 Personal history of colonic polyps: Secondary | ICD-10-CM

## 2015-02-19 MED ORDER — NA SULFATE-K SULFATE-MG SULF 17.5-3.13-1.6 GM/177ML PO SOLN: 1.0000 | Freq: Once | ORAL | Status: DC

## 2015-02-19 NOTE — Progress Notes (Signed)
No egg or soy allergy No issues with past sedation No diet pills No home 02 use emmi video declined  

## 2015-02-24 ENCOUNTER — Encounter: Payer: Self-pay | Admitting: Gastroenterology

## 2015-03-05 ENCOUNTER — Encounter: Payer: Self-pay | Admitting: Gastroenterology

## 2015-03-05 ENCOUNTER — Ambulatory Visit (AMBULATORY_SURGERY_CENTER): Payer: Commercial Managed Care - HMO | Admitting: Gastroenterology

## 2015-03-05 VITALS — BP 96/57 | HR 62 | Temp 97.8°F | Resp 20 | Ht 67.0 in | Wt 224.0 lb

## 2015-03-05 DIAGNOSIS — D122 Benign neoplasm of ascending colon: Secondary | ICD-10-CM

## 2015-03-05 DIAGNOSIS — D123 Benign neoplasm of transverse colon: Secondary | ICD-10-CM

## 2015-03-05 DIAGNOSIS — Z86010 Personal history of colonic polyps: Secondary | ICD-10-CM

## 2015-03-05 DIAGNOSIS — D120 Benign neoplasm of cecum: Secondary | ICD-10-CM

## 2015-03-05 DIAGNOSIS — D124 Benign neoplasm of descending colon: Secondary | ICD-10-CM

## 2015-03-05 MED ORDER — SODIUM CHLORIDE 0.9 % IV SOLN: 500.0000 mL | INTRAVENOUS | Status: DC

## 2015-03-05 NOTE — Progress Notes (Signed)
Called to room to assist during endoscopic procedure.  Patient ID and intended procedure confirmed with present staff. Received instructions for my participation in the procedure from the performing physician.  

## 2015-03-05 NOTE — Patient Instructions (Signed)
Colon polyps removed today and diverticulosis seen. Handouts given.   YOU HAD AN ENDOSCOPIC PROCEDURE TODAY AT [facility]:   Refer to the procedure report that was given to you for any specific questions about what was found during the examination.  If the procedure report does not answer your questions, please call your gastroenterologist to clarify.  If you requested that your care partner not be given the details of your procedure findings, then the procedure report has been included in a sealed envelope for you to review at your convenience later.  YOU SHOULD EXPECT: Some feelings of bloating in the abdomen. Passage of more gas than usual.  Walking can help get rid of the air that was put into your GI tract during the procedure and reduce the bloating. If you had a lower endoscopy (such as a colonoscopy or flexible sigmoidoscopy) you may notice spotting of blood in your stool or on the toilet paper. If you underwent a bowel prep for your procedure, you may not have a normal bowel movement for a few days.  Please Note:  You might notice some irritation and congestion in your nose or some drainage.  This is from the oxygen used during your procedure.  There is no need for concern and it should clear up in a day or so.  SYMPTOMS TO REPORT IMMEDIATELY:   Following lower endoscopy (colonoscopy or flexible sigmoidoscopy):  Excessive amounts of blood in the stool  Significant tenderness or worsening of abdominal pains  Swelling of the abdomen that is new, acute  Fever of 100F or higher   For urgent or emergent issues, a gastroenterologist can be reached at any hour by calling [phone].   DIET: Your first meal following the procedure should be a small meal and then it is ok to progress to your normal diet. Heavy or fried foods are harder to digest and may make you feel nauseous or bloated.  Likewise, meals heavy in dairy and vegetables can increase bloating.  Drink plenty of  fluids but you should avoid alcoholic beverages for 24 hours.  ACTIVITY:  You should plan to take it easy for the rest of today and you should NOT DRIVE or use heavy machinery until tomorrow (because of the sedation medicines used during the test).    FOLLOW UP: Our staff will call the number listed on your records the next business day following your procedure to check on you and address any questions or concerns that you may have regarding the information given to you following your procedure. If we do not reach you, we will leave a message.  However, if you are feeling well and you are not experiencing any problems, there is no need to return our call.  We will assume that you have returned to your regular daily activities without incident.  If any biopsies were taken you will be contacted by phone or by letter within the next 1-3 weeks.  Please call us at [phone] if you have not heard about the biopsies in 3 weeks.    SIGNATURES/CONFIDENTIALITY: You and/or your care partner have signed paperwork which will be entered into your electronic medical record.  These signatures attest to the fact that that the information above on your After Visit Summary has been reviewed and is understood.  Full responsibility of the confidentiality of this discharge information lies with you and/or your care-partner.

## 2015-03-05 NOTE — Progress Notes (Signed)
A/ox3, pleased with MAC, report to RN 

## 2015-03-06 ENCOUNTER — Telehealth: Payer: Self-pay

## 2015-03-06 NOTE — Op Note (Signed)
[place]  [address] [place], [address]   COLONOSCOPY PROCEDURE REPORT  PATIENT: Lisa [name], Lisa [name]  MR#: [id number] BIRTHDATE: 1955/09/11 , 73  yrs. old GENDER: female ENDOSCOPIST: Harl Bowie, MD REFERRED [id number] Linna Darner, M.D. PROCEDURE DATE:  03/05/2015 PROCEDURE:   Colonoscopy, surveillance , Colonoscopy with snare polypectomy, and Colonoscopy with cold biopsy polypectomy First Screening Colonoscopy - Avg.  risk and is 50 yrs.  old or older - No.  Prior Negative Screening - Now for repeat screening. N/A  History of Adenoma - Now for follow-up colonoscopy & has been > or = to 3 yrs.  Yes hx of adenoma.  Has been 3 or more years since last colonoscopy.  Polyps removed today? Yes ASA CLASS:   Class II INDICATIONS:Surveillance due to prior colonic neoplasia and PH Colon Adenoma. MEDICATIONS: Propofol 300 mg IV  DESCRIPTION OF PROCEDURE:   After the risks benefits and alternatives of the procedure were thoroughly explained, informed consent was obtained.  The digital rectal exam revealed no abnormalities of the rectum.   The LB [id number]  endoscope was introduced through the anus and advanced to the cecum, which was identified by both the appendix and ileocecal valve. No adverse events experienced.   The quality of the prep was good.  The instrument was then slowly withdrawn as the colon was fully examined. Estimated blood loss is zero unless otherwise noted in this procedure report.   COLON FINDINGS: Two sessile polyps ranging between 5-27mm in size were found in the transverse colon and ascending colon. Polypectomies were performed with a cold snare.  The resection was complete, the polyp tissue was completely retrieved and sent to histology.  3mm polyp in cecum and  polypectomy was performed with cold forceps.  The resection was complete, the polyp tissue was completely retrieved and sent to histology.   There was  moderate diverticulosis noted in the sigmoid colon.  Retroflexed views revealed no abnormalities. The time to cecum = 10.0 Withdrawal time = 10.6   The scope was withdrawn and the procedure completed. COMPLICATIONS: There were no immediate complications.  ENDOSCOPIC IMPRESSION: 1.   Two sessile polyps ranging between 5-76mm in size were found in the transverse colon and ascending colon; polypectomies were performed with a cold snare; polypectomy was performed with cold forceps 2.   There was moderate diverticulosis noted in the sigmoid colon  RECOMMENDATIONS: If the polyp(s) removed today are proven to be adenomatous (pre-cancerous) polyps, you will need a colonoscopy in 3 years. Otherwise you should continue to follow colorectal cancer screening guidelines for "routine risk" patients with a colonoscopy in 10 years.  You will receive a letter within 1-2 weeks with the results of your biopsy as well as final recommendations.  Please call my office if you have not received a letter after 3 weeks.  eSigned:  Harl Bowie, MD 03/05/2015 8:44 AM

## 2015-03-06 NOTE — Telephone Encounter (Signed)
  Follow up Call-  Call back number 03/05/2015  Post procedure Call Back phone  # [phone] hm  Permission to leave phone message Yes     Patient questions:  Do you have a fever, pain , or abdominal swelling? No. Pain Score  0 *  Have you tolerated food without any problems? Yes.    Have you been able to return to your normal activities? Yes.    Do you have any questions about your discharge instructions: Diet   No. Medications  No. Follow up visit  No.  Do you have questions or concerns about your Care? No.  Actions: * If pain score is 4 or above: No action needed, pain <4.

## 2015-03-11 ENCOUNTER — Encounter: Payer: Self-pay | Admitting: Gastroenterology

## 2015-04-08 ENCOUNTER — Ambulatory Visit (INDEPENDENT_AMBULATORY_CARE_PROVIDER_SITE_OTHER): Payer: Commercial Managed Care - HMO | Admitting: Internal Medicine

## 2015-04-08 ENCOUNTER — Encounter: Payer: Self-pay | Admitting: Internal Medicine

## 2015-04-08 VITALS — BP 122/86 | HR 76 | Temp 98.2°F | Resp 18 | Wt 224.0 lb

## 2015-04-08 DIAGNOSIS — R05 Cough: Secondary | ICD-10-CM | POA: Diagnosis not present

## 2015-04-08 DIAGNOSIS — Z0000 Encounter for general adult medical examination without abnormal findings: Secondary | ICD-10-CM

## 2015-04-08 DIAGNOSIS — R059 Cough, unspecified: Secondary | ICD-10-CM

## 2015-04-08 DIAGNOSIS — I10 Essential (primary) hypertension: Secondary | ICD-10-CM

## 2015-04-08 MED ORDER — LOSARTAN POTASSIUM-HCTZ 50-12.5 MG PO TABS: 1.0000 | ORAL_TABLET | Freq: Every day | ORAL | Status: DC

## 2015-04-08 NOTE — Patient Instructions (Signed)
We will change your BP medication - this was sent to your pharmacy.  Call if you have any side effects or your BP is too low or too high.  Let me know if your cough does not resolve.  Schedule your physical and have blood work done one week prior - the blood work was ordered.

## 2015-04-08 NOTE — Progress Notes (Signed)
Subjective:    Patient ID: Lisa [name], female    DOB: 02-21-1956, 60 y.o.   MRN: [id number]  HPI She is here to establish with a new pcp.  She is complaining of a cough.   Cough / Hypertension: She is taking her medication daily - started in September.  She has been having a dry cough and thinks it is related to her medication.  The cough got bad two months ago.  She had a cold two months ago and the cough continued. Her other cold symptoms resolved.  She is not compliant with a low sodium diet.  She denies chest pain, palpitations, edema, shortness of breath and regular headaches. She is not exercising regularly.  She does monitor her blood pressure at home - 124/80.     Hyperlipidemia:  She knows she may need to go on medication.  She has a family history of high cholesterol.  She would like to schedule a physical to discuss and get repeat blood work done.  She is not exercising.    Hyperglycemia, a1c normal:  Her blood work 4 months ago showed a slightly elevated glucose and a normal a1c.    Medications and allergies reviewed with patient and updated if appropriate.  Patient Active Problem List   Diagnosis Date Noted  . Hyperlipidemia 12/19/2014  . Essential hypertension 12/19/2014  . Hyperglycemia 12/19/2014  . History of colonic polyps 12/19/2014  . GERD 05/27/2009  . Left lumbar radiculopathy 05/27/2009  . Left foot drop 05/27/2009    Current Outpatient Prescriptions on File Prior to Visit  Medication Sig Dispense Refill  . aspirin (ASPIRIN EC) 81 MG EC tablet Take 81 mg by mouth daily. Swallow whole.    . calcium carbonate (TUMS - DOSED IN MG ELEMENTAL CALCIUM) 500 MG chewable tablet Chew 1 tablet by mouth as needed for indigestion or heartburn.    . Magnesium 250 MG TABS Take by mouth daily.    Marland Kitchen METRONIDAZOLE, TOPICAL, 0.75 % LOTN APP AA ON FACE ONCE OR BID UTD  2  . Multiple Vitamin (MULTI VITAMIN DAILY PO) Take by mouth.    . Omega-3 Fatty Acids (FISH OIL)  1200 MG CPDR Take 1,200 mg by mouth daily.    . Vitamin D, Cholecalciferol, 1000 UNITS TABS Take 1,000 Units by mouth daily.     No current facility-administered medications on file prior to visit.    Past Medical History  Diagnosis Date  . Heavy menses     irregular; on Provera  . GERD (gastroesophageal reflux disease)     no issue while on tums  . Heart murmur   . Cataract     removed from both eyes  . Hypertension   . Hyperlipidemia     diet controlled    Past Surgical History  Procedure Laterality Date  . Cesarean section      X2  . Lumbar disc surgery  2011    L4-5 ; Dr Shellia Carwin  . Colonoscopy  2008    [facility]  . Laser ablation condyloma cervical / vulvar    . Polypectomy    . Cataract extraction Bilateral   . Lasik Bilateral   . Vitrectomy Left 01-2015    Social History   Social History  . Marital Status: Married    Spouse Name: N/A  . Number of Children: N/A  . Years of Education: N/A   Social History Main Topics  . Smoking status: Never Smoker   . Smokeless  tobacco: Never Used  . Alcohol Use: No  . Drug Use: No  . Sexual Activity: Not on file   Other Topics Concern  . Not on file   Social History Narrative    Family History  Problem Relation Age of Onset  . Thyroid cancer Mother   . Hypertension Mother   . Diabetes Paternal Uncle   . Diabetes Paternal Grandmother   . Heart attack Paternal Grandmother     <65  . Stroke Paternal Grandfather   . Hypertension Sister   . Colon polyps Father   . Colon cancer Neg Hx   . Esophageal cancer Neg Hx   . Rectal cancer Neg Hx   . Stomach cancer Neg Hx     Review of Systems  Constitutional: Negative for fever and chills.  HENT: Negative for congestion, postnasal drip, sinus pressure and sore throat.   Respiratory: Positive for cough (dry) and wheezing (occasionally with coughing fits). Negative for shortness of breath.   Cardiovascular: Negative for chest pain, palpitations and leg swelling.    Gastrointestinal: Negative for nausea and abdominal pain.       Occasional GERD  Neurological: Positive for light-headedness (with first standing up - not new). Negative for headaches.       Objective:   Filed Vitals:   04/08/15 0904  BP: 122/86  Pulse: 76  Temp: 98.2 F (36.8 C)  Resp: 18   Filed Weights   04/08/15 0904  Weight: 224 lb (101.606 kg)   Body mass index is 35.08 kg/(m^2).   Physical Exam Constitutional: Appears well-developed and well-nourished. No distress.  Neck: B/L ear canals and TM normal.  No oropharynx erythema.  Neck supple. No tracheal deviation present. No thyromegaly present.  No carotid bruit. No cervical adenopathy.   Cardiovascular: Normal rate, regular rhythm and normal heart sounds.   No murmur heard.  No edema Pulmonary/Chest: Effort normal and breath sounds normal. No respiratory distress. No wheezes.           Assessment & Plan:    See Problem List for Assessment and Plan of chronic medical problems.   Will schedule a PE - blood work ordered for her to do prior

## 2015-04-08 NOTE — Progress Notes (Signed)
Pre visit review using our clinic review tool, if applicable. No additional management support is needed unless otherwise documented below in the visit note. 

## 2015-04-08 NOTE — Assessment & Plan Note (Signed)
BP controlled, but has a cough likely secondary to lisinopril so we will stop her current med Start losartan- hctz 50-12.5 She will monitor her BP at home - will call if too high or low Low sodium diet Exercise Recheck at PE

## 2015-04-08 NOTE — Assessment & Plan Note (Signed)
Likely due to lisinopril Stop lisinopril Start ARB Monitor BP Follow up for PE

## 2015-05-13 ENCOUNTER — Other Ambulatory Visit (INDEPENDENT_AMBULATORY_CARE_PROVIDER_SITE_OTHER): Payer: Commercial Managed Care - HMO

## 2015-05-13 DIAGNOSIS — R7989 Other specified abnormal findings of blood chemistry: Secondary | ICD-10-CM

## 2015-05-13 DIAGNOSIS — Z0000 Encounter for general adult medical examination without abnormal findings: Secondary | ICD-10-CM

## 2015-05-13 LAB — CBC WITH DIFFERENTIAL/PLATELET
BASOS PCT: 0.4 % (ref 0.0–3.0)
Basophils Absolute: 0 10*3/uL (ref 0.0–0.1)
EOS ABS: 0.3 10*3/uL (ref 0.0–0.7)
EOS PCT: 2.8 % (ref 0.0–5.0)
HCT: 42.9 % (ref 36.0–46.0)
Hemoglobin: 14.6 g/dL (ref 12.0–15.0)
Lymphocytes Relative: 25.5 % (ref 12.0–46.0)
Lymphs Abs: 2.6 10*3/uL (ref 0.7–4.0)
MCHC: 34.1 g/dL (ref 30.0–36.0)
MCV: 88.9 fl (ref 78.0–100.0)
MONO ABS: 0.7 10*3/uL (ref 0.1–1.0)
Monocytes Relative: 6.8 % (ref 3.0–12.0)
NEUTROS ABS: 6.6 10*3/uL (ref 1.4–7.7)
Neutrophils Relative %: 64.5 % (ref 43.0–77.0)
PLATELETS: 245 10*3/uL (ref 150.0–400.0)
RBC: 4.82 Mil/uL (ref 3.87–5.11)
RDW: 14 % (ref 11.5–15.5)
WBC: 10.2 10*3/uL (ref 4.0–10.5)

## 2015-05-13 LAB — COMPREHENSIVE METABOLIC PANEL
ALBUMIN: 4.1 g/dL (ref 3.5–5.2)
ALT: 48 U/L — ABNORMAL HIGH (ref 0–35)
AST: 29 U/L (ref 0–37)
Alkaline Phosphatase: 67 U/L (ref 39–117)
BUN: 13 mg/dL (ref 6–23)
CALCIUM: 9.4 mg/dL (ref 8.4–10.5)
CHLORIDE: 101 meq/L (ref 96–112)
CO2: 28 meq/L (ref 19–32)
CREATININE: 0.59 mg/dL (ref 0.40–1.20)
GFR: 110.45 mL/min (ref >60.00)
Glucose, Bld: 102 mg/dL — ABNORMAL HIGH (ref 70–99)
POTASSIUM: 4.1 meq/L (ref 3.5–5.1)
SODIUM: 135 meq/L (ref 135–145)
Total Bilirubin: 0.4 mg/dL (ref 0.2–1.2)
Total Protein: 7.2 g/dL (ref 6.0–8.3)

## 2015-05-13 LAB — LDL CHOLESTEROL, DIRECT: LDL DIRECT: 191 mg/dL

## 2015-05-13 LAB — LIPID PANEL
CHOL/HDL RATIO: 7
CHOLESTEROL: 307 mg/dL — AB (ref 0–200)
HDL: 45 mg/dL (ref >39.00)
NonHDL: 262.01
Triglycerides: 287 mg/dL — ABNORMAL HIGH (ref 0.0–149.0)
VLDL: 57.4 mg/dL — AB (ref 0.0–40.0)

## 2015-05-13 LAB — HEMOGLOBIN A1C: HEMOGLOBIN A1C: 5.6 % (ref 4.6–6.5)

## 2015-05-13 LAB — TSH: TSH: 2.01 u[IU]/mL (ref 0.35–4.50)

## 2015-05-22 ENCOUNTER — Encounter: Payer: Self-pay | Admitting: Internal Medicine

## 2015-05-22 ENCOUNTER — Ambulatory Visit (INDEPENDENT_AMBULATORY_CARE_PROVIDER_SITE_OTHER): Payer: Commercial Managed Care - HMO | Admitting: Internal Medicine

## 2015-05-22 VITALS — BP 144/92 | HR 74 | Temp 97.9°F | Resp 16 | Wt 227.0 lb

## 2015-05-22 DIAGNOSIS — Z23 Encounter for immunization: Secondary | ICD-10-CM | POA: Diagnosis not present

## 2015-05-22 DIAGNOSIS — M26629 Arthralgia of temporomandibular joint, unspecified side: Secondary | ICD-10-CM | POA: Insufficient documentation

## 2015-05-22 DIAGNOSIS — R059 Cough, unspecified: Secondary | ICD-10-CM

## 2015-05-22 DIAGNOSIS — E785 Hyperlipidemia, unspecified: Secondary | ICD-10-CM | POA: Diagnosis not present

## 2015-05-22 DIAGNOSIS — I10 Essential (primary) hypertension: Secondary | ICD-10-CM

## 2015-05-22 DIAGNOSIS — R05 Cough: Secondary | ICD-10-CM | POA: Diagnosis not present

## 2015-05-22 DIAGNOSIS — R739 Hyperglycemia, unspecified: Secondary | ICD-10-CM

## 2015-05-22 DIAGNOSIS — Z0000 Encounter for general adult medical examination without abnormal findings: Secondary | ICD-10-CM

## 2015-05-22 MED ORDER — ATORVASTATIN CALCIUM 20 MG PO TABS: 20.0000 mg | ORAL_TABLET | Freq: Every day | ORAL | Status: DC

## 2015-05-22 MED ORDER — HYDROCHLOROTHIAZIDE 25 MG PO TABS: 25.0000 mg | ORAL_TABLET | Freq: Every day | ORAL | Status: DC

## 2015-05-22 MED ORDER — LOSARTAN POTASSIUM 50 MG PO TABS: 50.0000 mg | ORAL_TABLET | Freq: Every day | ORAL | Status: DC

## 2015-05-22 NOTE — Progress Notes (Signed)
Pre visit review using our clinic review tool, if applicable. No additional management support is needed unless otherwise documented below in the visit note. 

## 2015-05-22 NOTE — Patient Instructions (Addendum)
We have reviewed your prior records including labs and tests today.  Medications reviewed and updated.  Changes include starting generic lipitor 20 mg daily and increasing your blood pressure medication.  We will retest your cholesterol and liver tests in 6-8 weeks.  All other Health Maintenance issues reviewed.   All recommended immunizations and age-appropriate screenings are up-to-date.  Shingles vaccine administered today.   Your prescription(s) have been submitted to your pharmacy. Please take as directed and contact our office if you believe you are having problem(s) with the medication(s).   Please schedule followup in 6 months   Health Maintenance, Female Adopting a healthy lifestyle and getting preventive care can go a long way to promote health and wellness. Talk with your health care provider about what schedule of regular examinations is right for you. This is a good chance for you to check in with your provider about disease prevention and staying healthy. In between checkups, there are plenty of things you can do on your own. Experts have done a lot of research about which lifestyle changes and preventive measures are most likely to keep you healthy. Ask your health care provider for more information. WEIGHT AND DIET  Eat a healthy diet  Be sure to include plenty of vegetables, fruits, low-fat dairy products, and lean protein.  Do not eat a lot of foods high in solid fats, added sugars, or salt.  Get regular exercise. This is one of the most important things you can do for your health.  Most adults should exercise for at least 150 minutes each week. The exercise should increase your heart rate and make you sweat (moderate-intensity exercise).  Most adults should also do strengthening exercises at least twice a week. This is in addition to the moderate-intensity exercise.  Maintain a healthy weight  Body mass index (BMI) is a measurement that can be used to identify  possible weight problems. It estimates body fat based on height and weight. Your health care provider can help determine your BMI and help you achieve or maintain a healthy weight.  For females 81 years of age and older:   A BMI below 18.5 is considered underweight.  A BMI of 18.5 to 24.9 is normal.  A BMI of 25 to 29.9 is considered overweight.  A BMI of 30 and above is considered obese.  Watch levels of cholesterol and blood lipids  You should start having your blood tested for lipids and cholesterol at 61 years of age, then have this test every 5 years.  You may need to have your cholesterol levels checked more often if:  Your lipid or cholesterol levels are high.  You are older than 61 years of age.  You are at high risk for heart disease.  CANCER SCREENING   Lung Cancer  Lung cancer screening is recommended for adults 105-27 years old who are at high risk for lung cancer because of a history of smoking.  A yearly low-dose CT scan of the lungs is recommended for people who:  Currently smoke.  Have quit within the past 15 years.  Have at least a 30-pack-year history of smoking. A pack year is smoking an average of one pack of cigarettes a day for 1 year.  Yearly screening should continue until it has been 15 years since you quit.  Yearly screening should stop if you develop a health problem that would prevent you from having lung cancer treatment.  Breast Cancer  Practice breast self-awareness. This means understanding  how your breasts normally appear and feel.  It also means doing regular breast self-exams. Let your health care provider know about any changes, no matter how small.  If you are in your 20s or 30s, you should have a clinical breast exam (CBE) by a health care provider every 1-3 years as part of a regular health exam.  If you are 45 or older, have a CBE every year. Also consider having a breast X-ray (mammogram) every year.  If you have a family  history of breast cancer, talk to your health care provider about genetic screening.  If you are at high risk for breast cancer, talk to your health care provider about having an MRI and a mammogram every year.  Breast cancer gene (BRCA) assessment is recommended for women who have family members with BRCA-related cancers. BRCA-related cancers include:  Breast.  Ovarian.  Tubal.  Peritoneal cancers.  Results of the assessment will determine the need for genetic counseling and BRCA1 and BRCA2 testing. Cervical Cancer Your health care provider may recommend that you be screened regularly for cancer of the pelvic organs (ovaries, uterus, and vagina). This screening involves a pelvic examination, including checking for microscopic changes to the surface of your cervix (Pap test). You may be encouraged to have this screening done every 3 years, beginning at age 61.  For women ages 76-65, health care providers may recommend pelvic exams and Pap testing every 3 years, or they may recommend the Pap and pelvic exam, combined with testing for human papilloma virus (HPV), every 5 years. Some types of HPV increase your risk of cervical cancer. Testing for HPV may also be done on women of any age with unclear Pap test results.  Other health care providers may not recommend any screening for nonpregnant women who are considered low risk for pelvic cancer and who do not have symptoms. Ask your health care provider if a screening pelvic exam is right for you.  If you have had past treatment for cervical cancer or a condition that could lead to cancer, you need Pap tests and screening for cancer for at least 20 years after your treatment. If Pap tests have been discontinued, your risk factors (such as having a new sexual partner) need to be reassessed to determine if screening should resume. Some women have medical problems that increase the chance of getting cervical cancer. In these cases, your health care  provider may recommend more frequent screening and Pap tests. Colorectal Cancer  This type of cancer can be detected and often prevented.  Routine colorectal cancer screening usually begins at 61 years of age and continues through 61 years of age.  Your health care provider may recommend screening at an earlier age if you have risk factors for colon cancer.  Your health care provider may also recommend using home test kits to check for hidden blood in the stool.  A small camera at the end of a tube can be used to examine your colon directly (sigmoidoscopy or colonoscopy). This is done to check for the earliest forms of colorectal cancer.  Routine screening usually begins at age 89.  Direct examination of the colon should be repeated every 5-10 years through 61 years of age. However, you may need to be screened more often if early forms of precancerous polyps or small growths are found. Skin Cancer  Check your skin from head to toe regularly.  Tell your health care provider about any new moles or changes in moles,  especially if there is a change in a mole's shape or color.  Also tell your health care provider if you have a mole that is larger than the size of a pencil eraser.  Always use sunscreen. Apply sunscreen liberally and repeatedly throughout the day.  Protect yourself by wearing long sleeves, pants, a wide-brimmed hat, and sunglasses whenever you are outside. HEART DISEASE, DIABETES, AND HIGH BLOOD PRESSURE   High blood pressure causes heart disease and increases the risk of stroke. High blood pressure is more likely to develop in:  People who have blood pressure in the high end of the normal range (130-139/85-89 mm Hg).  People who are overweight or obese.  People who are African American.  If you are 20-46 years of age, have your blood pressure checked every 3-5 years. If you are 12 years of age or older, have your blood pressure checked every year. You should have your  blood pressure measured twice--once when you are at a hospital or clinic, and once when you are not at a hospital or clinic. Record the average of the two measurements. To check your blood pressure when you are not at a hospital or clinic, you can use:  An automated blood pressure machine at a pharmacy.  A home blood pressure monitor.  If you are between 81 years and 11 years old, ask your health care provider if you should take aspirin to prevent strokes.  Have regular diabetes screenings. This involves taking a blood sample to check your fasting blood sugar level.  If you are at a normal weight and have a low risk for diabetes, have this test once every three years after 61 years of age.  If you are overweight and have a high risk for diabetes, consider being tested at a younger age or more often. PREVENTING INFECTION  Hepatitis B  If you have a higher risk for hepatitis B, you should be screened for this virus. You are considered at high risk for hepatitis B if:  You were born in a country where hepatitis B is common. Ask your health care provider which countries are considered high risk.  Your parents were born in a high-risk country, and you have not been immunized against hepatitis B (hepatitis B vaccine).  You have HIV or AIDS.  You use needles to inject street drugs.  You live with someone who has hepatitis B.  You have had sex with someone who has hepatitis B.  You get hemodialysis treatment.  You take certain medicines for conditions, including cancer, organ transplantation, and autoimmune conditions. Hepatitis C  Blood testing is recommended for:  Everyone born from [birth year] through 1966.  Anyone with known risk factors for hepatitis C. Sexually transmitted infections (STIs)  You should be screened for sexually transmitted infections (STIs) including gonorrhea and chlamydia if:  You are sexually active and are younger than 61 years of age.  You are older than 61  years of age and your health care provider tells you that you are at risk for this type of infection.  Your sexual activity has changed since you were last screened and you are at an increased risk for chlamydia or gonorrhea. Ask your health care provider if you are at risk.  If you do not have HIV, but are at risk, it may be recommended that you take a prescription medicine daily to prevent HIV infection. This is called pre-exposure prophylaxis (PrEP). You are considered at risk if:  You are sexually active  and do not regularly use condoms or know the HIV status of your partner(s).  You take drugs by injection.  You are sexually active with a partner who has HIV. Talk with your health care provider about whether you are at high risk of being infected with HIV. If you choose to begin PrEP, you should first be tested for HIV. You should then be tested every 3 months for as long as you are taking PrEP.  PREGNANCY   If you are premenopausal and you may become pregnant, ask your health care provider about preconception counseling.  If you may become pregnant, take 400 to 800 micrograms (mcg) of folic acid every day.  If you want to prevent pregnancy, talk to your health care provider about birth control (contraception). OSTEOPOROSIS AND MENOPAUSE   Osteoporosis is a disease in which the bones lose minerals and strength with aging. This can result in serious bone fractures. Your risk for osteoporosis can be identified using a bone density scan.  If you are 72 years of age or older, or if you are at risk for osteoporosis and fractures, ask your health care provider if you should be screened.  Ask your health care provider whether you should take a calcium or vitamin D supplement to lower your risk for osteoporosis.  Menopause may have certain physical symptoms and risks.  Hormone replacement therapy may reduce some of these symptoms and risks. Talk to your health care provider about whether  hormone replacement therapy is right for you.  HOME CARE INSTRUCTIONS   Schedule regular health, dental, and eye exams.  Stay current with your immunizations.   Do not use any tobacco products including cigarettes, chewing tobacco, or electronic cigarettes.  If you are pregnant, do not drink alcohol.  If you are breastfeeding, limit how much and how often you drink alcohol.  Limit alcohol intake to no more than 1 drink per day for nonpregnant women. One drink equals 12 ounces of beer, 5 ounces of wine, or 1 ounces of hard liquor.  Do not use street drugs.  Do not share needles.  Ask your health care provider for help if you need support or information about quitting drugs.  Tell your health care provider if you often feel depressed.  Tell your health care provider if you have ever been abused or do not feel safe at home.   This information is not intended to replace advice given to you by your health care provider. Make sure you discuss any questions you have with your health care provider.   Document Released: 09/21/2010 Document Revised: 03/29/2014 Document Reviewed: 02/07/2013 Elsevier Interactive Patient Education [employer].

## 2015-05-22 NOTE — Assessment & Plan Note (Signed)
Improve significantly since stopping lisinopril Still has a mild tickle, which may be related to allergies-it is mild and does not bother her

## 2015-05-22 NOTE — Assessment & Plan Note (Signed)
Cholesterol persistently elevated Stressed the importance of regular exercise and weight loss Low fat/cholesterol diet Calculated ASCVD risk and it is recommended that she consider statin therapy. Discussed risks and benefits Start atorvastatin 20 mg daily Recheck lipid panel, CMP 6-8 weeks She will call if she has any side effects or concerns
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
## 2015-05-22 NOTE — Addendum Note (Signed)
Addended by: Terence Lux B on: 05/22/2015 01:16 PM   Modules accepted: Orders

## 2015-05-22 NOTE — Assessment & Plan Note (Signed)
Not ideally controlled Continue losartan 50 mg daily, increase hydrochlorothiazide to 25 mg daily, which may also help with her left foot edema Start regular exercise and work on weight loss

## 2016-04-22 NOTE — Progress Notes (Signed)
Subjective:    Patient ID: Lisa [name], female    DOB: 03-06-1956, 61 y.o.   MRN: [id number]  HPI She is here for a physical exam.   Elevated BP:  She checks her BP at home and it is often > 150/80's.  She is taking her medication daily.    She started cholesterol medication last year.   Over the last year she has had increasing joint pain. She is unsure if it is related to the statin or not.    Chronic back pain s/p surgery for herniated disc:  Her legs feel very heavy.  Any exercise she has done she experiences weakness/dizziness from that.  She is having left hip pain.  This feels exactly like the pain she had just prior to her back surgery years ago.  She is having a hard time walking. She is concerned something is going on with her back.  She has a left foot drop from her back surgery and she is unsure if that is throwing her gait off and causing more problems.   Pain in right arm:  This started the past few months.  It occurs at night.  She will wake up at night and her hand is on fire.  If she gets her neck is in just the right position it will go away.  She has had cervical radiculopathy in the past, but it was on the left side.   PT helped at that time.   Medications and allergies reviewed with patient and updated if appropriate.  Patient Active Problem List   Diagnosis Date Noted  . DOE (dyspnea on exertion) 04/23/2016  . Left hip pain 04/23/2016  . Leg heaviness 04/23/2016  . Right arm pain 04/23/2016  . Arthralgia 04/23/2016  . TMJ arthralgia 05/22/2015  . Hyperlipidemia 12/19/2014  . Essential hypertension 12/19/2014  . Hyperglycemia 12/19/2014  . History of colonic polyps 12/19/2014  . Left lumbar radiculopathy 05/27/2009  . Left foot drop 05/27/2009    Current Outpatient Prescriptions on File Prior to Visit  Medication Sig Dispense Refill  . aspirin (ASPIRIN EC) 81 MG EC tablet Take 81 mg by mouth daily. Swallow whole.    Marland Kitchen atorvastatin (LIPITOR) 20 MG  tablet Take 1 tablet (20 mg total) by mouth daily. 90 tablet 3  . calcium carbonate (TUMS - DOSED IN MG ELEMENTAL CALCIUM) 500 MG chewable tablet Chew 1 tablet by mouth as needed for indigestion or heartburn.    . hydrochlorothiazide (HYDRODIURIL) 25 MG tablet Take 1 tablet (25 mg total) by mouth daily. 90 tablet 3  . Magnesium 250 MG TABS Take by mouth daily.    . Multiple Vitamin (MULTI VITAMIN DAILY PO) Take by mouth.    . Omega-3 Fatty Acids (FISH OIL) 1200 MG CPDR Take 1,200 mg by mouth daily.    . SOOLANTRA 1 % CREA APP AA ON THE FACE QD  3  . Vitamin D, Cholecalciferol, 1000 UNITS TABS Take 1,000 Units by mouth daily.    . [DISCONTINUED] losartan-hydrochlorothiazide (HYZAAR) 50-12.5 MG tablet Take 1 tablet by mouth daily. 90 tablet 3   No current facility-administered medications on file prior to visit.     Past Medical History:  Diagnosis Date  . Cataract    removed from both eyes  . GERD (gastroesophageal reflux disease)    no issue while on tums  . Heart murmur   . Heavy menses    irregular; on Provera  . Hyperlipidemia  diet controlled  . Hypertension     Past Surgical History:  Procedure Laterality Date  . CATARACT EXTRACTION Bilateral   . CESAREAN SECTION     X2  . COLONOSCOPY  2008   [facility]  . LASER ABLATION CONDYLOMA CERVICAL / VULVAR    . LASIK Bilateral   . LUMBAR DISC SURGERY  2011   L4-5 ; Dr Shellia Carwin  . POLYPECTOMY    . VITRECTOMY Left 01-2015    Social History   Social History  . Marital status: Married    Spouse name: N/A  . Number of children: N/A  . Years of education: N/A   Social History Main Topics  . Smoking status: Never Smoker  . Smokeless tobacco: Never Used  . Alcohol use No  . Drug use: No  . Sexual activity: Not Asked   Other Topics Concern  . None   Social History Narrative  . None    Family History  Problem Relation Age of Onset  . Thyroid cancer Mother   . Hypertension Mother   . Colon polyps Father   .  Hyperlipidemia Father   . Atrial fibrillation Father     has PPM  . Diabetes Paternal Uncle   . Diabetes Paternal Grandmother   . Heart attack Paternal Grandmother     <65  . Stroke Paternal Grandfather   . Hypertension Sister   . Hyperlipidemia Sister   . Colon cancer Neg Hx   . Esophageal cancer Neg Hx   . Rectal cancer Neg Hx   . Stomach cancer Neg Hx     Review of Systems  Constitutional: Negative for chills and fever.  Eyes: Negative for visual disturbance.  Respiratory: Positive for shortness of breath (with moderate exertion). Negative for cough and wheezing.   Cardiovascular: Positive for leg swelling (left foot - chronic). Negative for chest pain and palpitations.  Gastrointestinal: Negative for abdominal pain, blood in stool, constipation, diarrhea and nausea.       Rare gerd - takes Tums  Genitourinary: Negative for dysuria and hematuria.  Musculoskeletal: Positive for arthralgias (diffuse), back pain (chronic lower back pain) and neck stiffness. Negative for myalgias.  Skin: Negative for color change and rash.  Neurological: Positive for light-headedness (when first stands up), numbness (right hand, left toes) and headaches (occ). Negative for dizziness.  Psychiatric/Behavioral: Negative for dysphoric mood. The patient is not nervous/anxious.        Objective:   Vitals:   04/23/16 0807  BP: (!) 162/104  Pulse: (!) 101  Resp: 16  Temp: 97.7 F (36.5 C)   Filed Weights   04/23/16 0807  Weight: 228 lb (103.4 kg)   Body mass index is 35.71 kg/m.  Wt Readings from Last 3 Encounters:  04/23/16 228 lb (103.4 kg)  05/22/15 227 lb (103 kg)  04/08/15 224 lb (101.6 kg)     Physical Exam Constitutional: She appears well-developed and well-nourished. No distress.  HENT:  Head: Normocephalic and atraumatic.  Right Ear: External ear normal. Normal ear canal and TM Left Ear: External ear normal.  Normal ear canal and TM Mouth/Throat: Oropharynx is clear and  moist.  Eyes: Conjunctivae and EOM are normal.  Neck: Neck supple. No tracheal deviation present. No thyromegaly present.  No carotid bruit  Cardiovascular: Normal rate, regular rhythm and normal heart sounds.   No murmur heard.  No edema. Pulmonary/Chest: Effort normal and breath sounds normal. No respiratory distress. She has no wheezes. She has no rales.  Breast:  deferred to Gyn Abdominal: Soft. She exhibits no distension. There is no tenderness.  Lymphadenopathy: She has no cervical adenopathy.  Skin: Skin is warm and dry. She is not diaphoretic.  Psychiatric: She has a normal mood and affect. Her behavior is normal.         Assessment & Plan:   Physical exam: Screening blood work    ordered Immunizations  tdap due,  Flu due - deferred flu and td Colonoscopy  Up to date  Mammogram  Up to date  Gyn   Up to date  Eye exams  Up to date  EKG - done 2013 - RBBB - repeat today - Sinus rhythm at 72 bpm, no change, RBBB not present compared to EKG from 2013 Exercise  None - due to pain Weight - advised weight loss Skin - no concerns, sees derm regularly Substance abuse  none  See Problem List for Assessment and Plan of chronic medical problems.  FU annually, sooner if needed

## 2016-04-22 NOTE — Assessment & Plan Note (Signed)
a1c in past 5.6% Will check a1c Discussed risk of developing diabetes

## 2016-04-22 NOTE — Patient Instructions (Addendum)
Test(s) ordered today. Your results will be released to [place] (or called to you) after review, usually within 72hours after test completion. If any changes need to be made, you will be notified at that same time.  All other Health Maintenance issues reviewed.   All recommended immunizations and age-appropriate screenings are up-to-date or discussed.  No immunizations administered today.   An EKG was done today.   Medications reviewed and updated.  Changes include increasing losartan to 100 mg daily  A referral was ordered for physical therapy and neurosurgery  Please followup in one year for a physical, sooner if your BP at home is not under 140/90 consistently   Health Maintenance, Female Introduction Adopting a healthy lifestyle and getting preventive care can go a long way to promote health and wellness. Talk with your health care provider about what schedule of regular examinations is right for you. This is a good chance for you to check in with your provider about disease prevention and staying healthy. In between checkups, there are plenty of things you can do on your own. Experts have done a lot of research about which lifestyle changes and preventive measures are most likely to keep you healthy. Ask your health care provider for more information. Weight and diet Eat a healthy diet  Be sure to include plenty of vegetables, fruits, low-fat dairy products, and lean protein.  Do not eat a lot of foods high in solid fats, added sugars, or salt.  Get regular exercise. This is one of the most important things you can do for your health.  Most adults should exercise for at least 150 minutes each week. The exercise should increase your heart rate and make you sweat (moderate-intensity exercise).  Most adults should also do strengthening exercises at least twice a week. This is in addition to the moderate-intensity exercise. Maintain a healthy weight  Body mass index (BMI) is a  measurement that can be used to identify possible weight problems. It estimates body fat based on height and weight. Your health care provider can help determine your BMI and help you achieve or maintain a healthy weight.  For females 3 years of age and older:  A BMI below 18.5 is considered underweight.  A BMI of 18.5 to 24.9 is normal.  A BMI of 25 to 29.9 is considered overweight.  A BMI of 30 and above is considered obese. Watch levels of cholesterol and blood lipids  You should start having your blood tested for lipids and cholesterol at 62 years of age, then have this test every 5 years.  You may need to have your cholesterol levels checked more often if:  Your lipid or cholesterol levels are high.  You are older than 62 years of age.  You are at high risk for heart disease. Cancer screening Lung Cancer  Lung cancer screening is recommended for adults 60-73 years old who are at high risk for lung cancer because of a history of smoking.  A yearly low-dose CT scan of the lungs is recommended for people who:  Currently smoke.  Have quit within the past 15 years.  Have at least a 30-pack-year history of smoking. A pack year is smoking an average of one pack of cigarettes a day for 1 year.  Yearly screening should continue until it has been 15 years since you quit.  Yearly screening should stop if you develop a health problem that would prevent you from having lung cancer treatment. Breast Cancer  Practice  breast self-awareness. This means understanding how your breasts normally appear and feel.  It also means doing regular breast self-exams. Let your health care provider know about any changes, no matter how small.  If you are in your 20s or 30s, you should have a clinical breast exam (CBE) by a health care provider every 1-3 years as part of a regular health exam.  If you are 28 or older, have a CBE every year. Also consider having a breast X-ray (mammogram) every  year.  If you have a family history of breast cancer, talk to your health care provider about genetic screening.  If you are at high risk for breast cancer, talk to your health care provider about having an MRI and a mammogram every year.  Breast cancer gene (BRCA) assessment is recommended for women who have family members with BRCA-related cancers. BRCA-related cancers include:  Breast.  Ovarian.  Tubal.  Peritoneal cancers.  Results of the assessment will determine the need for genetic counseling and BRCA1 and BRCA2 testing. Cervical Cancer  Your health care provider may recommend that you be screened regularly for cancer of the pelvic organs (ovaries, uterus, and vagina). This screening involves a pelvic examination, including checking for microscopic changes to the surface of your cervix (Pap test). You may be encouraged to have this screening done every 3 years, beginning at age 23.  For women ages 73-65, health care providers may recommend pelvic exams and Pap testing every 3 years, or they may recommend the Pap and pelvic exam, combined with testing for human papilloma virus (HPV), every 5 years. Some types of HPV increase your risk of cervical cancer. Testing for HPV may also be done on women of any age with unclear Pap test results.  Other health care providers may not recommend any screening for nonpregnant women who are considered low risk for pelvic cancer and who do not have symptoms. Ask your health care provider if a screening pelvic exam is right for you.  If you have had past treatment for cervical cancer or a condition that could lead to cancer, you need Pap tests and screening for cancer for at least 20 years after your treatment. If Pap tests have been discontinued, your risk factors (such as having a new sexual partner) need to be reassessed to determine if screening should resume. Some women have medical problems that increase the chance of getting cervical cancer. In  these cases, your health care provider may recommend more frequent screening and Pap tests. Colorectal Cancer  This type of cancer can be detected and often prevented.  Routine colorectal cancer screening usually begins at 62 years of age and continues through 62 years of age.  Your health care provider may recommend screening at an earlier age if you have risk factors for colon cancer.  Your health care provider may also recommend using home test kits to check for hidden blood in the stool.  A small camera at the end of a tube can be used to examine your colon directly (sigmoidoscopy or colonoscopy). This is done to check for the earliest forms of colorectal cancer.  Routine screening usually begins at age 56.  Direct examination of the colon should be repeated every 5-10 years through 62 years of age. However, you may need to be screened more often if early forms of precancerous polyps or small growths are found. Skin Cancer  Check your skin from head to toe regularly.  Tell your health care provider about any  new moles or changes in moles, especially if there is a change in a mole's shape or color.  Also tell your health care provider if you have a mole that is larger than the size of a pencil eraser.  Always use sunscreen. Apply sunscreen liberally and repeatedly throughout the day.  Protect yourself by wearing long sleeves, pants, a wide-brimmed hat, and sunglasses whenever you are outside. Heart disease, diabetes, and high blood pressure  High blood pressure causes heart disease and increases the risk of stroke. High blood pressure is more likely to develop in:  People who have blood pressure in the high end of the normal range (130-139/85-89 mm Hg).  People who are overweight or obese.  People who are African American.  If you are 79-83 years of age, have your blood pressure checked every 3-5 years. If you are 48 years of age or older, have your blood pressure checked  every year. You should have your blood pressure measured twice-once when you are at a hospital or clinic, and once when you are not at a hospital or clinic. Record the average of the two measurements. To check your blood pressure when you are not at a hospital or clinic, you can use:  An automated blood pressure machine at a pharmacy.  A home blood pressure monitor.  If you are between 51 years and 43 years old, ask your health care provider if you should take aspirin to prevent strokes.  Have regular diabetes screenings. This involves taking a blood sample to check your fasting blood sugar level.  If you are at a normal weight and have a low risk for diabetes, have this test once every three years after 62 years of age.  If you are overweight and have a high risk for diabetes, consider being tested at a younger age or more often. Preventing infection Hepatitis B  If you have a higher risk for hepatitis B, you should be screened for this virus. You are considered at high risk for hepatitis B if:  You were born in a country where hepatitis B is common. Ask your health care provider which countries are considered high risk.  Your parents were born in a high-risk country, and you have not been immunized against hepatitis B (hepatitis B vaccine).  You have HIV or AIDS.  You use needles to inject street drugs.  You live with someone who has hepatitis B.  You have had sex with someone who has hepatitis B.  You get hemodialysis treatment.  You take certain medicines for conditions, including cancer, organ transplantation, and autoimmune conditions. Hepatitis C  Blood testing is recommended for:  Everyone born from [birth year] through 1966.  Anyone with known risk factors for hepatitis C. Sexually transmitted infections (STIs)  You should be screened for sexually transmitted infections (STIs) including gonorrhea and chlamydia if:  You are sexually active and are younger than 62 years of  age.  You are older than 62 years of age and your health care provider tells you that you are at risk for this type of infection.  Your sexual activity has changed since you were last screened and you are at an increased risk for chlamydia or gonorrhea. Ask your health care provider if you are at risk.  If you do not have HIV, but are at risk, it may be recommended that you take a prescription medicine daily to prevent HIV infection. This is called pre-exposure prophylaxis (PrEP). You are considered at risk if:  You are sexually active and do not regularly use condoms or know the HIV status of your partner(s).  You take drugs by injection.  You are sexually active with a partner who has HIV. Talk with your health care provider about whether you are at high risk of being infected with HIV. If you choose to begin PrEP, you should first be tested for HIV. You should then be tested every 3 months for as long as you are taking PrEP. Pregnancy  If you are premenopausal and you may become pregnant, ask your health care provider about preconception counseling.  If you may become pregnant, take 400 to 800 micrograms (mcg) of folic acid every day.  If you want to prevent pregnancy, talk to your health care provider about birth control (contraception). Osteoporosis and menopause  Osteoporosis is a disease in which the bones lose minerals and strength with aging. This can result in serious bone fractures. Your risk for osteoporosis can be identified using a bone density scan.  If you are 67 years of age or older, or if you are at risk for osteoporosis and fractures, ask your health care provider if you should be screened.  Ask your health care provider whether you should take a calcium or vitamin D supplement to lower your risk for osteoporosis.  Menopause may have certain physical symptoms and risks.  Hormone replacement therapy may reduce some of these symptoms and risks. Talk to your health  care provider about whether hormone replacement therapy is right for you. Follow these instructions at home:  Schedule regular health, dental, and eye exams.  Stay current with your immunizations.  Do not use any tobacco products including cigarettes, chewing tobacco, or electronic cigarettes.  If you are pregnant, do not drink alcohol.  If you are breastfeeding, limit how much and how often you drink alcohol.  Limit alcohol intake to no more than 1 drink per day for nonpregnant women. One drink equals 12 ounces of beer, 5 ounces of wine, or 1 ounces of hard liquor.  Do not use street drugs.  Do not share needles.  Ask your health care provider for help if you need support or information about quitting drugs.  Tell your health care provider if you often feel depressed.  Tell your health care provider if you have ever been abused or do not feel safe at home. This information is not intended to replace advice given to you by your health care provider. Make sure you discuss any questions you have with your health care provider. Document Released: 09/21/2010 Document Revised: 08/14/2015 Document Reviewed: 12/10/2014  2017 [employer]

## 2016-04-23 ENCOUNTER — Ambulatory Visit (INDEPENDENT_AMBULATORY_CARE_PROVIDER_SITE_OTHER)
Admission: RE | Admit: 2016-04-23 | Discharge: 2016-04-23 | Disposition: A | Discharge status: Home / Self Care | Payer: Commercial Managed Care - HMO | Source: Ambulatory Visit | Attending: Internal Medicine | Admitting: Internal Medicine

## 2016-04-23 ENCOUNTER — Ambulatory Visit (INDEPENDENT_AMBULATORY_CARE_PROVIDER_SITE_OTHER): Payer: Commercial Managed Care - HMO | Admitting: Internal Medicine

## 2016-04-23 ENCOUNTER — Other Ambulatory Visit (INDEPENDENT_AMBULATORY_CARE_PROVIDER_SITE_OTHER): Payer: Commercial Managed Care - HMO

## 2016-04-23 ENCOUNTER — Encounter: Payer: Self-pay | Admitting: Internal Medicine

## 2016-04-23 VITALS — BP 162/104 | HR 101 | Temp 97.7°F | Resp 16 | Ht 67.0 in | Wt 228.0 lb

## 2016-04-23 DIAGNOSIS — M50323 Other cervical disc degeneration at C6-C7 level: Secondary | ICD-10-CM | POA: Diagnosis not present

## 2016-04-23 DIAGNOSIS — M25552 Pain in left hip: Secondary | ICD-10-CM

## 2016-04-23 DIAGNOSIS — R29898 Other symptoms and signs involving the musculoskeletal system: Secondary | ICD-10-CM

## 2016-04-23 DIAGNOSIS — R7303 Prediabetes: Secondary | ICD-10-CM

## 2016-04-23 DIAGNOSIS — E7800 Pure hypercholesterolemia, unspecified: Secondary | ICD-10-CM | POA: Diagnosis not present

## 2016-04-23 DIAGNOSIS — I10 Essential (primary) hypertension: Secondary | ICD-10-CM

## 2016-04-23 DIAGNOSIS — M79601 Pain in right arm: Secondary | ICD-10-CM | POA: Diagnosis not present

## 2016-04-23 DIAGNOSIS — M545 Low back pain: Secondary | ICD-10-CM | POA: Diagnosis not present

## 2016-04-23 DIAGNOSIS — R0609 Other forms of dyspnea: Secondary | ICD-10-CM | POA: Diagnosis not present

## 2016-04-23 DIAGNOSIS — Z0000 Encounter for general adult medical examination without abnormal findings: Secondary | ICD-10-CM

## 2016-04-23 DIAGNOSIS — R739 Hyperglycemia, unspecified: Secondary | ICD-10-CM

## 2016-04-23 DIAGNOSIS — Z1159 Encounter for screening for other viral diseases: Secondary | ICD-10-CM

## 2016-04-23 DIAGNOSIS — R768 Other specified abnormal immunological findings in serum: Secondary | ICD-10-CM

## 2016-04-23 DIAGNOSIS — M2550 Pain in unspecified joint: Secondary | ICD-10-CM

## 2016-04-23 DIAGNOSIS — M50322 Other cervical disc degeneration at C5-C6 level: Secondary | ICD-10-CM | POA: Diagnosis not present

## 2016-04-23 LAB — HEPATITIS C ANTIBODY: HCV Ab: REACTIVE — AB

## 2016-04-23 LAB — CBC WITH DIFFERENTIAL/PLATELET
BASOS ABS: 0 10*3/uL (ref 0.0–0.1)
Basophils Relative: 0.5 % (ref 0.0–3.0)
EOS ABS: 0.2 10*3/uL (ref 0.0–0.7)
Eosinophils Relative: 2 % (ref 0.0–5.0)
HCT: 42.1 % (ref 36.0–46.0)
HEMOGLOBIN: 14.2 g/dL (ref 12.0–15.0)
Lymphocytes Relative: 25.3 % (ref 12.0–46.0)
Lymphs Abs: 2 10*3/uL (ref 0.7–4.0)
MCHC: 33.6 g/dL (ref 30.0–36.0)
MCV: 87.8 fl (ref 78.0–100.0)
MONO ABS: 0.6 10*3/uL (ref 0.1–1.0)
Monocytes Relative: 7.5 % (ref 3.0–12.0)
Neutro Abs: 5 10*3/uL (ref 1.4–7.7)
Neutrophils Relative %: 64.7 % (ref 43.0–77.0)
Platelets: 262 10*3/uL (ref 150.0–400.0)
RBC: 4.8 Mil/uL (ref 3.87–5.11)
RDW: 14.2 % (ref 11.5–15.5)
WBC: 7.7 10*3/uL (ref 4.0–10.5)

## 2016-04-23 LAB — COMPREHENSIVE METABOLIC PANEL
ALK PHOS: 82 U/L (ref 39–117)
ALT: 44 U/L — AB (ref 0–35)
AST: 25 U/L (ref 0–37)
Albumin: 4.2 g/dL (ref 3.5–5.2)
BUN: 18 mg/dL (ref 6–23)
CALCIUM: 9.9 mg/dL (ref 8.4–10.5)
CO2: 27 mEq/L (ref 19–32)
Chloride: 103 mEq/L (ref 96–112)
Creatinine, Ser: 0.64 mg/dL (ref 0.40–1.20)
GFR: 100.23 mL/min (ref >60.00)
GLUCOSE: 111 mg/dL — AB (ref 70–99)
Potassium: 3.5 mEq/L (ref 3.5–5.1)
Sodium: 139 mEq/L (ref 135–145)
TOTAL PROTEIN: 7.3 g/dL (ref 6.0–8.3)
Total Bilirubin: 0.6 mg/dL (ref 0.2–1.2)

## 2016-04-23 LAB — TSH: TSH: 1.47 u[IU]/mL (ref 0.35–4.50)

## 2016-04-23 LAB — LIPID PANEL
CHOLESTEROL: 177 mg/dL (ref 0–200)
HDL: 47.2 mg/dL (ref >39.00)
LDL Cholesterol: 102 mg/dL — ABNORMAL HIGH (ref 0–99)
NonHDL: 129.96
Total CHOL/HDL Ratio: 4
Triglycerides: 142 mg/dL (ref 0.0–149.0)
VLDL: 28.4 mg/dL (ref 0.0–40.0)

## 2016-04-23 LAB — HEMOGLOBIN A1C: HEMOGLOBIN A1C: 5.8 % (ref 4.6–6.5)

## 2016-04-23 LAB — C-REACTIVE PROTEIN: CRP: 0.3 mg/dL — ABNORMAL LOW (ref 0.5–20.0)

## 2016-04-23 LAB — CK: Total CK: 232 U/L — ABNORMAL HIGH (ref 7–177)

## 2016-04-23 LAB — SEDIMENTATION RATE: Sed Rate: 26 mm/hr (ref 0–30)

## 2016-04-23 MED ORDER — LOSARTAN POTASSIUM 100 MG PO TABS: 100.0000 mg | ORAL_TABLET | Freq: Every day | ORAL | 5 refills | Status: DC

## 2016-04-23 NOTE — Assessment & Plan Note (Signed)
Possible cervical radiculopathy, which she has had in the past - only on left side Check c spine xray Refer to PT May need cervical MRI She is interested in seeing a neurosurgeon  - referred to Dr Vertell Limber

## 2016-04-23 NOTE — Progress Notes (Signed)
Pre visit review using our clinic review tool, if applicable. No additional management support is needed unless otherwise documented below in the visit note. 

## 2016-04-23 NOTE — Assessment & Plan Note (Signed)
Possible lumbar radiculopathy, which she has had in the past - had surgery in 2011 Check lumbar spine xray May need lumbar MRI She is interested in seeing a neurosurgeon  - referred to Dr Vertell Limber

## 2016-04-23 NOTE — Assessment & Plan Note (Signed)
On lipitor Having diffuse joint pain - statin may be the cause Hold statin for two weeks Exercise when able Low fat/chol diet Work on weight loss

## 2016-04-23 NOTE — Assessment & Plan Note (Signed)
Diffuse ? Related to statin - stop for two weeks and she if there is an improvement Check CK, cmp Check autoimmune labs  Depending on above may need to see rheumatology

## 2016-04-23 NOTE — Assessment & Plan Note (Signed)
BP has been elevated Increase losartan to 100 mg daily Continue hctz 25 mg daily Continue to monitor at home - BP less than 140/90 consistently Cmp, tsh Work on weight loss Exercise when able

## 2016-04-23 NOTE — Assessment & Plan Note (Signed)
EKG normal today I think her DOE is related to deconditioning and being overweight Work on weight loss and increase exercise when able Monitor symptoms closely - follow up if she develops new symptoms or DOE worsens

## 2016-04-24 ENCOUNTER — Encounter: Payer: Self-pay | Admitting: Internal Medicine

## 2016-04-24 DIAGNOSIS — M5416 Radiculopathy, lumbar region: Secondary | ICD-10-CM

## 2016-04-26 LAB — RHEUMATOID FACTOR

## 2016-04-26 LAB — ANA: Anti Nuclear Antibody(ANA): NEGATIVE

## 2016-04-28 ENCOUNTER — Encounter: Payer: Self-pay | Admitting: Internal Medicine

## 2016-04-28 DIAGNOSIS — R768 Other specified abnormal immunological findings in serum: Secondary | ICD-10-CM | POA: Insufficient documentation

## 2016-04-28 DIAGNOSIS — R7303 Prediabetes: Secondary | ICD-10-CM | POA: Insufficient documentation

## 2016-04-28 DIAGNOSIS — R748 Abnormal levels of other serum enzymes: Secondary | ICD-10-CM

## 2016-04-28 LAB — HEPATITIS C RNA QUANTITATIVE
HCV Quantitative Log: <1.18 Log IU/mL
HCV Quantitative: <15 IU/mL

## 2016-04-30 ENCOUNTER — Other Ambulatory Visit (INDEPENDENT_AMBULATORY_CARE_PROVIDER_SITE_OTHER): Payer: Commercial Managed Care - HMO

## 2016-04-30 ENCOUNTER — Ambulatory Visit: Payer: Commercial Managed Care - HMO | Attending: Internal Medicine | Admitting: Physical Therapy

## 2016-04-30 ENCOUNTER — Encounter: Payer: Self-pay | Admitting: Physical Therapy

## 2016-04-30 DIAGNOSIS — R252 Cramp and spasm: Secondary | ICD-10-CM | POA: Insufficient documentation

## 2016-04-30 DIAGNOSIS — R748 Abnormal levels of other serum enzymes: Secondary | ICD-10-CM | POA: Diagnosis not present

## 2016-04-30 DIAGNOSIS — M436 Torticollis: Secondary | ICD-10-CM

## 2016-04-30 DIAGNOSIS — M79601 Pain in right arm: Secondary | ICD-10-CM | POA: Insufficient documentation

## 2016-04-30 DIAGNOSIS — M542 Cervicalgia: Secondary | ICD-10-CM

## 2016-04-30 LAB — LACTATE DEHYDROGENASE: LDH: 227 U/L (ref 120–250)

## 2016-04-30 LAB — CK: CK TOTAL: 183 U/L — AB (ref 7–177)

## 2016-04-30 NOTE — Therapy (Signed)
[place], [place], [address] Phone: [phone]   Fax:  [phone]  Physical Therapy Evaluation  Patient Details  Name: Lisa [name] MRN: [id number] Date of Birth: 1956-01-29 Referring Provider: Quay Burow  Encounter Date: 04/30/2016      PT End of Session - 04/30/16 1032    Visit Number 1   Date for PT Re-Evaluation 06/25/16   PT Start Time 0930   PT Stop Time [id number]   PT Time Calculation (min) 45 min   Activity Tolerance Patient tolerated treatment well      Past Medical History:  Diagnosis Date  . Cataract    removed from both eyes  . GERD (gastroesophageal reflux disease)    no issue while on tums  . Heart murmur   . Heavy menses    irregular; on Provera  . Hyperlipidemia    diet controlled  . Hypertension     Past Surgical History:  Procedure Laterality Date  . CATARACT EXTRACTION Bilateral   . CESAREAN SECTION     X2  . COLONOSCOPY  2008   [facility]  . LASER ABLATION CONDYLOMA CERVICAL / VULVAR    . LASIK Bilateral   . LUMBAR DISC SURGERY  2011   L4-5 ; Dr Shellia Carwin  . POLYPECTOMY    . VITRECTOMY Left 01-2015    There were no vitals filed for this visit.       Subjective Assessment - 04/30/16 0935    Subjective Pt presents with neck OA.  Starting in December the neck has stiffened up.  R Right tips of the fingers and the whole hand have started tingling; mainly at night.  Had success with traction.     Diagnostic tests xray ; Degenerative disc disease is noted at C5-6 and C6-7, with mild spurring and stenosis   Patient Stated Goals decrease pain; sleeping better   Currently in Pain? Yes   Pain Score 0-No pain  only tight now;  up to 9/10 at night in the hand   Pain Location Hand   Pain Orientation Right   Pain Descriptors / Indicators Burning;Constant   Pain Type Chronic pain   Pain Onset More than a month ago            [facility] PT Assessment -  04/30/16 0001      Assessment   Medical Diagnosis neck and R arm pain   Referring Provider Burns   Onset Date/Surgical Date 02/20/16   Hand Dominance Right   Next MD Visit not scheduled   Prior Therapy yes     Balance Screen   Has the patient fallen in the past 6 months No     Prior Function   Level of Independence Independent     Sensation   Additional Comments dermatomes intact     Posture/Postural Control   Posture/Postural Control Postural limitations   Postural Limitations Rounded Shoulders;Forward head;Increased thoracic kyphosis     ROM / Strength   AROM / PROM / Strength AROM;PROM;Strength     AROM   AROM Assessment Site Cervical   Cervical Flexion 45   Cervical Extension 40   Cervical - Right Side Bend 30   Cervical - Left Side Bend 20   Cervical - Right Rotation 45   Cervical - Left Rotation 35     PROM   Overall PROM Comments WFL in supine no increased pain     Strength   Overall Strength  Comments R myotomes strong and painfree     Flexibility   Soft Tissue Assessment /Muscle Length --  UT and LS tightness bil     Palpation   Spinal mobility 2/6 graded Cspine and upper tspine with pain levels T2-4 CPA   Palpation comment R                    [facility] Adult PT Treatment/Exercise - 04/30/16 0001      Modalities   Modalities Traction     Traction   Type of Traction Cervical   Min (lbs) 0   Max (lbs) 14   Hold Time 3   Rest Time 1   Time 10     Manual Therapy   Manual Therapy Joint mobilization   Manual therapy comments seated SNAGs C7-T5 grade IV- 3x30 each                PT Education - 04/30/16 1030    Education provided Yes   Education Details HEP, POC, sleeping posture   Person(s) Educated Patient   Methods Explanation;Demonstration;Handout   Comprehension Verbalized understanding;Returned demonstration          PT Short Term Goals - 04/30/16 1203      PT SHORT TERM GOAL #1   Title pt will be independent  with HEP   Time 2   Period Weeks           PT Long Term Goals - 04/30/16 1203      PT LONG TERM GOAL #1   Title pt will return to no night pain in the R hand   Time 8   Period Weeks   Status New     PT LONG TERM GOAL #2   Title pt will improve cervical AROM to WNL   Time 8   Period Weeks   Status New     PT LONG TERM GOAL #3   Title pt will report resting neck stiffness as returned to baseline   Time 8   Period Weeks   Status New               Plan - 04/30/16 1032    Clinical Impression Statement Pt presents with cervical OA and stenosis with current R radicular symptoms into the hand; worse at night.  Pain is decreased with traction manually and with the unit.  Sig decreased joint mobility upper Tspine and lower Cspine as well as postural dysfunction contributing.  no weakness present    Rehab Potential Excellent   PT Frequency 2x / week   PT Duration 8 weeks   PT Treatment/Interventions Electrical Stimulation;Moist Heat;Traction;Therapeutic activities;Therapeutic exercise;Manual techniques;Taping   PT Next Visit Plan contiue traction   Consulted and Agree with Plan of Care Patient      Patient will benefit from skilled therapeutic intervention in order to improve the following deficits and impairments:  Decreased range of motion, Decreased activity tolerance, Pain  Visit Diagnosis: Neck pain - Plan: PT PLAN OF CARE CERT/RE-CERT  Pain in right arm - Plan: PT PLAN OF CARE CERT/RE-CERT  Neck stiffness - Plan: PT PLAN OF CARE CERT/RE-CERT     Problem List Patient Active Problem List   Diagnosis Date Noted  . Prediabetes 04/28/2016  . Hepatitis C antibody test positive 04/28/2016  . DOE (dyspnea on exertion) 04/23/2016  . Left hip pain 04/23/2016  . Leg heaviness 04/23/2016  . Right arm pain 04/23/2016  . Arthralgia 04/23/2016  . TMJ arthralgia 05/22/2015  .  Hyperlipidemia 12/19/2014  . Essential hypertension 12/19/2014  . Hyperglycemia  12/19/2014  . History of colonic polyps 12/19/2014  . Left lumbar radiculopathy 05/27/2009  . Left foot drop 05/27/2009    Stark Bray 04/30/2016, 12:06 PM  [place] [address] [place], [place], [address] Phone: [phone]   Fax:  [phone]  Name: Lisa [name] MRN: [id number] Date of Birth: Sep 19, 1955

## 2016-05-03 ENCOUNTER — Other Ambulatory Visit: Payer: Self-pay | Admitting: Internal Medicine

## 2016-05-03 DIAGNOSIS — R748 Abnormal levels of other serum enzymes: Secondary | ICD-10-CM | POA: Insufficient documentation

## 2016-05-03 LAB — ALDOLASE: ALDOLASE: 11.1 U/L — AB (ref <=8.1)

## 2016-05-04 ENCOUNTER — Ambulatory Visit: Payer: Commercial Managed Care - HMO | Admitting: Physical Therapy

## 2016-05-04 ENCOUNTER — Encounter: Payer: Self-pay | Admitting: Physical Therapy

## 2016-05-04 DIAGNOSIS — M542 Cervicalgia: Secondary | ICD-10-CM

## 2016-05-04 DIAGNOSIS — M79601 Pain in right arm: Secondary | ICD-10-CM

## 2016-05-04 DIAGNOSIS — M436 Torticollis: Secondary | ICD-10-CM

## 2016-05-04 NOTE — Therapy (Signed)
[place], [place], [address] Phone: [phone]   Fax:  [phone]  Physical Therapy Treatment  Patient Details  Name: Lisa [name] MRN: [id number] Date of Birth: 1955/06/30 Referring Provider: Quay Burow  Encounter Date: 05/04/2016      PT End of Session - 05/04/16 0943    Visit Number 2   Date for PT Re-Evaluation 06/25/16   PT Start Time 0930   PT Stop Time 1015   PT Time Calculation (min) 45 min   Activity Tolerance Patient tolerated treatment well   Behavior During Therapy [facility] for tasks assessed/performed      Past Medical History:  Diagnosis Date  . Cataract    removed from both eyes  . GERD (gastroesophageal reflux disease)    no issue while on tums  . Heart murmur   . Heavy menses    irregular; on Provera  . Hyperlipidemia    diet controlled  . Hypertension     Past Surgical History:  Procedure Laterality Date  . CATARACT EXTRACTION Bilateral   . CESAREAN SECTION     X2  . COLONOSCOPY  2008   [facility]  . LASER ABLATION CONDYLOMA CERVICAL / VULVAR    . LASIK Bilateral   . LUMBAR DISC SURGERY  2011   L4-5 ; Dr Shellia Carwin  . POLYPECTOMY    . VITRECTOMY Left 01-2015    There were no vitals filed for this visit.      Subjective Assessment - 05/04/16 0944    Subjective Pt presenting with neck OA and tingling in her R hand and finger tips. Pt reporting burning sensation at night into R hand. Pt reporting 5/10 pain today.    Diagnostic tests xray ; Degenerative disc disease is noted at C5-6 and C6-7, with mild spurring and stenosis   Patient Stated Goals decrease pain; sleeping better   Currently in Pain? Yes   Pain Score 5    Pain Location Neck   Pain Orientation Right   Pain Descriptors / Indicators Burning   Pain Type Chronic pain   Pain Frequency Intermittent   Aggravating Factors  sitting, slumping    Pain Relieving Factors stretching, exercise, heat   Effect of Pain on Daily Activities difficulty with household chores   Multiple Pain Sites No                         [facility] Adult PT Treatment/Exercise - 05/04/16 0001      Posture/Postural Control   Posture/Postural Control Postural limitations   Postural Limitations Rounded Shoulders;Forward head;Increased thoracic kyphosis     Exercises   Exercises Neck     Neck Exercises: Theraband   Scapula Retraction 10 reps     Neck Exercises: Seated   Neck Retraction 10 reps;5 secs   Cervical Rotation Both;5 reps     Modalities   Modalities Traction     Manual Therapy   Manual Therapy Joint mobilization   Manual therapy comments occipital release, soft tissue mobilizations in occipital region and R upper trap                PT Education - 05/04/16 0937    Education provided Yes   Education Details Posture, Sleeping positions, reviewed HEP   Person(s) Educated Patient   Methods Explanation;Demonstration   Comprehension Verbalized understanding;Returned demonstration          PT Short Term Goals - 05/04/16  [place] #1   Title pt will be independent with HEP   Time 2   Period Weeks   Status On-going           PT Long Term Goals - 05/04/16 1007      PT LONG TERM GOAL #1   Title pt will return to no night pain in the R hand   Time 8   Period Weeks   Status New     PT LONG TERM GOAL #2   Title pt will improve cervical AROM to WNL   Time 8   Period Weeks   Status New     PT LONG TERM GOAL #3   Title pt will report resting neck stiffness as returned to baseline   Time 8   Period Weeks   Status New               Plan - 05/04/16 1002    Clinical Impression Statement Pt presents with cervical OA and stenosis with current R radicular symptoms into hand and fingers. pt reporting pain is worse at night. Pain today was 5/10. Pt reporting less stiffness/tightness at end of session with no pain reported during  traction.    Rehab Potential Excellent   PT Frequency 2x / week   PT Duration 8 weeks   PT Treatment/Interventions Electrical Stimulation;Moist Heat;Traction;Therapeutic activities;Therapeutic exercise;Manual techniques;Taping   PT Next Visit Plan contiue traction, cervical ROM, scapular retraction, rows, shoulder depression   PT Home Exercise Plan Continue HEP, chin tucks, upper trap stretch, scapular retraction   Consulted and Agree with Plan of Care Patient      Patient will benefit from skilled therapeutic intervention in order to improve the following deficits and impairments:  Decreased range of motion, Decreased activity tolerance, Pain  Visit Diagnosis: Neck pain  Pain in right arm  Neck stiffness     Problem List Patient Active Problem List   Diagnosis Date Noted  . Elevated CK 05/03/2016  . Prediabetes 04/28/2016  . Hepatitis C antibody test positive 04/28/2016  . DOE (dyspnea on exertion) 04/23/2016  . Left hip pain 04/23/2016  . Leg heaviness 04/23/2016  . Right arm pain 04/23/2016  . Arthralgia 04/23/2016  . TMJ arthralgia 05/22/2015  . Hyperlipidemia 12/19/2014  . Essential hypertension 12/19/2014  . Hyperglycemia 12/19/2014  . History of colonic polyps 12/19/2014  . Left lumbar radiculopathy 05/27/2009  . Left foot drop 05/27/2009    Oretha Caprice, MPT 05/04/2016, 10:25 AM  [place] [address] [place], [place], [address] Phone: [phone]   Fax:  [phone]  Name: Lisa [name] MRN: [id number] Date of Birth: 1955/05/29

## 2016-05-07 ENCOUNTER — Encounter: Payer: Self-pay | Admitting: Physical Therapy

## 2016-05-07 ENCOUNTER — Ambulatory Visit: Payer: Commercial Managed Care - HMO | Admitting: Physical Therapy

## 2016-05-07 DIAGNOSIS — M542 Cervicalgia: Secondary | ICD-10-CM

## 2016-05-07 DIAGNOSIS — M436 Torticollis: Secondary | ICD-10-CM

## 2016-05-07 DIAGNOSIS — M79601 Pain in right arm: Secondary | ICD-10-CM

## 2016-05-07 NOTE — Therapy (Signed)
[place], [place], [address] Phone: [phone]   Fax:  [phone]  Physical Therapy Treatment  Patient Details  Name: Lisa [name] MRN: [id number] Date of Birth: [birth year] Referring Provider: Quay Burow  Encounter Date: 05/07/2016      PT End of Session - 05/07/16 0925    Visit Number 3   Date for PT Re-Evaluation 06/25/16   PT Start Time 0850   PT Stop Time 0950   PT Time Calculation (min) 60 min   Activity Tolerance Patient tolerated treatment well;Patient limited by fatigue   Behavior During Therapy [facility] for tasks assessed/performed      Past Medical History:  Diagnosis Date  . Cataract    removed from both eyes  . GERD (gastroesophageal reflux disease)    no issue while on tums  . Heart murmur   . Heavy menses    irregular; on Provera  . Hyperlipidemia    diet controlled  . Hypertension     Past Surgical History:  Procedure Laterality Date  . CATARACT EXTRACTION Bilateral   . CESAREAN SECTION     X2  . COLONOSCOPY  2008   [facility]  . LASER ABLATION CONDYLOMA CERVICAL / VULVAR    . LASIK Bilateral   . LUMBAR DISC SURGERY  2011   L4-5 ; Dr Shellia Carwin  . POLYPECTOMY    . VITRECTOMY Left 01-2015    There were no vitals filed for this visit.      Subjective Assessment - 05/07/16 0858    Subjective Patient reports that she is continuing to have pain.  Reports a mild relief for a short amount of time with the current treatment.    Currently in Pain? Yes   Pain Score 5    Pain Location Neck   Pain Orientation Right   Pain Descriptors / Indicators Burning   Pain Type Chronic pain                         [facility] Adult PT Treatment/Exercise - 05/07/16 0001      Self-Care   Self-Care Posture   Posture Went over posture and body mechanics for computer use     Neck Exercises: Machines for Strengthening   UBE (Upper Arm Bike) Level 2 x 4 minutes   Cybex Row 10# 2x10   Other Machines for Strengthening lat pull down 10# needed some assist   Other Machines for Strengthening 15# straight arm pull down with core engagement     Neck Exercises: Theraband   Scapula Retraction 20 reps;Red   Other Theraband Exercises yellow tband back to wall ER bilaterally     Neck Exercises: Standing   Neck Retraction 20 reps;3 secs   Other Standing Exercises cervical extension with ball at T1     Neck Exercises: Seated   Cervical Rotation Both;5 reps   W Back Weights (lbs) in standing 2x10 reps   Shoulder Shrugs 15 reps   Shoulder Rolls 15 reps     Modalities   Modalities Traction;Electrical Stimulation;Moist Heat     Moist Heat Therapy   Number Minutes Moist Heat 15 Minutes   Moist Heat Location Cervical     Electrical Stimulation   Electrical Stimulation Location cervical   Electrical Stimulation Action IFC   Electrical Stimulation Parameters supine   Electrical Stimulation Goals Pain     Traction   Type of Traction Cervical   Max (  lbs) 14   Hold Time static   Time 13                  PT Short Term Goals - 05/07/16 0935      PT SHORT TERM GOAL #1   Title pt will be independent with HEP   Status Achieved           PT Long Term Goals - 05/04/16 1007      PT LONG TERM GOAL #1   Title pt will return to no night pain in the R hand   Time 8   Period Weeks   Status New     PT LONG TERM GOAL #2   Title pt will improve cervical AROM to WNL   Time 8   Period Weeks   Status New     PT LONG TERM GOAL #3   Title pt will report resting neck stiffness as returned to baseline   Time 8   Period Weeks   Status New               Plan - 05/07/16 [id number]    Clinical Impression Statement patient with a very weak core.  She has very weak arms and has difficulty with some of the exercises.  She has some pain with activities and has difficulty with any core exercises.   PT Next Visit Plan See if we can increase  activities, gave her info about home cervical traction   Consulted and Agree with Plan of Care Patient      Patient will benefit from skilled therapeutic intervention in order to improve the following deficits and impairments:  Decreased range of motion, Decreased activity tolerance, Pain  Visit Diagnosis: Neck pain  Cervicalgia  Pain in right arm  Neck stiffness     Problem List Patient Active Problem List   Diagnosis Date Noted  . Elevated CK 05/03/2016  . Prediabetes 04/28/2016  . Hepatitis C antibody test positive 04/28/2016  . DOE (dyspnea on exertion) 04/23/2016  . Left hip pain 04/23/2016  . Leg heaviness 04/23/2016  . Right arm pain 04/23/2016  . Arthralgia 04/23/2016  . TMJ arthralgia 05/22/2015  . Hyperlipidemia 12/19/2014  . Essential hypertension 12/19/2014  . Hyperglycemia 12/19/2014  . History of colonic polyps 12/19/2014  . Left lumbar radiculopathy 05/27/2009  . Left foot drop 05/27/2009    Sumner Boast., PT 05/07/2016, 9:37 AM  [place] [address] [place], [place], [address] Phone: [phone]   Fax:  [phone]  Name: RASHAWN BRASIER MRN: [id number] Date of Birth: 10-23-1955

## 2016-05-08 ENCOUNTER — Ambulatory Visit
Admission: RE | Admit: 2016-05-08 | Discharge: 2016-05-08 | Disposition: A | Discharge status: Home / Self Care | Payer: Commercial Managed Care - HMO | Source: Ambulatory Visit | Attending: Internal Medicine | Admitting: Internal Medicine

## 2016-05-08 DIAGNOSIS — M5126 Other intervertebral disc displacement, lumbar region: Secondary | ICD-10-CM | POA: Diagnosis not present

## 2016-05-08 DIAGNOSIS — M5416 Radiculopathy, lumbar region: Secondary | ICD-10-CM

## 2016-05-09 ENCOUNTER — Encounter: Payer: Self-pay | Admitting: Internal Medicine

## 2016-05-11 ENCOUNTER — Ambulatory Visit: Payer: Commercial Managed Care - HMO | Admitting: Physical Therapy

## 2016-05-11 ENCOUNTER — Encounter: Payer: Self-pay | Admitting: Physical Therapy

## 2016-05-11 DIAGNOSIS — M542 Cervicalgia: Secondary | ICD-10-CM | POA: Diagnosis not present

## 2016-05-11 DIAGNOSIS — M79601 Pain in right arm: Secondary | ICD-10-CM

## 2016-05-11 DIAGNOSIS — M436 Torticollis: Secondary | ICD-10-CM

## 2016-05-11 NOTE — Therapy (Signed)
[place], [place], [address] Phone: [phone]   Fax:  [phone]  Physical Therapy Treatment  Patient Details  Name: Lisa [name] MRN: [id number] Date of Birth: 1955-06-22 Referring Provider: Quay Burow  Encounter Date: 05/11/2016      PT End of Session - 05/11/16 1353    Visit Number 4   Date for PT Re-Evaluation 06/25/16   PT Start Time 1324   PT Stop Time 1422   PT Time Calculation (min) 58 min      Past Medical History:  Diagnosis Date  . Cataract    removed from both eyes  . GERD (gastroesophageal reflux disease)    no issue while on tums  . Heart murmur   . Heavy menses    irregular; on Provera  . Hyperlipidemia    diet controlled  . Hypertension     Past Surgical History:  Procedure Laterality Date  . CATARACT EXTRACTION Bilateral   . CESAREAN SECTION     X2  . COLONOSCOPY  2008   [facility]  . LASER ABLATION CONDYLOMA CERVICAL / VULVAR    . LASIK Bilateral   . LUMBAR DISC SURGERY  2011   L4-5 ; Dr Shellia Carwin  . POLYPECTOMY    . VITRECTOMY Left 01-2015    There were no vitals filed for this visit.      Subjective Assessment - 05/11/16 1314    Subjective tightness. still can sleep without arm/hand numbness   Currently in Pain? Yes   Pain Score 5    Pain Location Neck   Pain Orientation Right                         [facility] Adult PT Treatment/Exercise - 05/11/16 0001      Neck Exercises: Standing   Other Standing Exercises 3# shld ext and ER 10 times     Neck Exercises: Seated   Other Seated Exercise 3# row seated 15 times  3# ext 15 times   Other Seated Exercise 3# ER 10 times     Moist Heat Therapy   Number Minutes Moist Heat 15 Minutes   Moist Heat Location Cervical     Electrical Stimulation   Electrical Stimulation Location cervical   Electrical Stimulation Action IFC   Electrical Stimulation Parameters supine   Electrical  Stimulation Goals Pain     Traction   Type of Traction Cervical   Max (lbs) 14   Hold Time static   Time 13     Manual Therapy   Manual Therapy Neural Stretch   Manual therapy comments seated SNAGS and NAGS                PT Education - 05/11/16 1340    Education provided Yes   Education Details neural tension stretch 5-10 reps,hold 5-10 sec 4 times daily   Person(s) Educated Patient   Methods Explanation;Demonstration   Comprehension Verbalized understanding;Returned demonstration          PT Short Term Goals - 05/07/16 0935      PT SHORT TERM GOAL #1   Title pt will be independent with HEP   Status Achieved           PT Long Term Goals - 05/04/16 1007      PT LONG TERM GOAL #1   Title pt will return to no night pain in the R hand   Time  8   Period Weeks   Status New     PT LONG TERM GOAL #2   Title pt will improve cervical AROM to WNL   Time 8   Period Weeks   Status New     PT LONG TERM GOAL #3   Title pt will report resting neck stiffness as returned to baseline   Time 8   Period Weeks   Status New               Plan - 05/11/16 1354    Clinical Impression Statement pt very tight RT UE, positive NT and limited IR with pain. Cuing with ex for posture and decrease compensations. Issued NT for HEp   PT Next Visit Plan MT, NT and PROM      Patient will benefit from skilled therapeutic intervention in order to improve the following deficits and impairments:  Decreased range of motion, Decreased activity tolerance, Pain  Visit Diagnosis: Cervicalgia  Neck pain  Pain in right arm  Neck stiffness     Problem List Patient Active Problem List   Diagnosis Date Noted  . Elevated CK 05/03/2016  . Prediabetes 04/28/2016  . Hepatitis C antibody test positive 04/28/2016  . DOE (dyspnea on exertion) 04/23/2016  . Left hip pain 04/23/2016  . Leg heaviness 04/23/2016  . Right arm pain 04/23/2016  . Arthralgia 04/23/2016  . TMJ  arthralgia 05/22/2015  . Hyperlipidemia 12/19/2014  . Essential hypertension 12/19/2014  . Hyperglycemia 12/19/2014  . History of colonic polyps 12/19/2014  . Left lumbar radiculopathy 05/27/2009  . Left foot drop 05/27/2009    PAYSEUR,ANGIE PTA 05/11/2016, 1:56 PM  [place] [address] [address] [place], [place], [address] Phone: [phone]   Fax:  [phone]  Name: Lisa [name] MRN: [id number] Date of Birth: 1955/09/12

## 2016-05-13 ENCOUNTER — Ambulatory Visit: Payer: Commercial Managed Care - HMO | Admitting: Physical Therapy

## 2016-05-13 ENCOUNTER — Encounter: Payer: Self-pay | Admitting: Physical Therapy

## 2016-05-13 DIAGNOSIS — M542 Cervicalgia: Secondary | ICD-10-CM

## 2016-05-13 DIAGNOSIS — R252 Cramp and spasm: Secondary | ICD-10-CM

## 2016-05-13 NOTE — Therapy (Signed)
[place] [place], [place], [address] Phone: [phone]   Fax:  [phone]  Physical Therapy Treatment  Patient Details  Name: Lisa [name] MRN: [id number] Date of Birth: [birth year] Referring Provider: Quay Burow  Encounter Date: 05/13/2016      PT End of Session - 05/13/16 1403    Visit Number 5   Date for PT Re-Evaluation 06/25/16   PT Start Time 1314   PT Stop Time 1400   PT Time Calculation (min) 46 min   Activity Tolerance Patient tolerated treatment well;Patient limited by fatigue   Behavior During Therapy [facility] for tasks assessed/performed      Past Medical History:  Diagnosis Date  . Cataract    removed from both eyes  . GERD (gastroesophageal reflux disease)    no issue while on tums  . Heart murmur   . Heavy menses    irregular; on Provera  . Hyperlipidemia    diet controlled  . Hypertension     Past Surgical History:  Procedure Laterality Date  . CATARACT EXTRACTION Bilateral   . CESAREAN SECTION     X2  . COLONOSCOPY  2008   [facility]  . LASER ABLATION CONDYLOMA CERVICAL / VULVAR    . LASIK Bilateral   . LUMBAR DISC SURGERY  2011   L4-5 ; Dr Shellia Carwin  . POLYPECTOMY    . VITRECTOMY Left 01-2015    There were no vitals filed for this visit.      Subjective Assessment - 05/13/16 1318    Subjective Reports that she has about the same pain, still some very tender area in the left upper trap area,                          [facility] Adult PT Treatment/Exercise - 05/13/16 0001      Neck Exercises: Machines for Strengthening   UBE (Upper Arm Bike) Level 4 x 4 minutes   Cybex Row 10# 2x10   Other Machines for Strengthening lat pull down 10# needed some assist   Other Machines for Strengthening 15# straight arm pull down with core engagement     Neck Exercises: Theraband   Scapula Retraction 20 reps;Red   Other Theraband Exercises yellow tband back to  wall ER bilaterally     Neck Exercises: Standing   Neck Retraction 20 reps;3 secs   Other Standing Exercises weighted ball overhead lift     Neck Exercises: Seated   Shoulder Shrugs 15 reps   Shoulder Rolls 15 reps     Hand Exercises for Cervical Radiculopathy   Other Hand Exercise for Cervical Radiculopathy neural tension in the UE's performed stretches     Electrical Stimulation   Electrical Stimulation Location cervical upper trap area   Electrical Stimulation Action US/estim combo   Electrical Stimulation Parameters sitting    Electrical Stimulation Goals Pain     Manual Therapy   Manual Therapy Neural Stretch   Manual therapy comments STM to the upper traps with vibration                  PT Short Term Goals - 05/07/16 0935      PT SHORT TERM GOAL #1   Title pt will be independent with HEP   Status Achieved           PT Long Term Goals - 05/04/16 1007      PT LONG  TERM GOAL #1   Title pt will return to no night pain in the R hand   Time 8   Period Weeks   Status New     PT LONG TERM GOAL #2   Title pt will improve cervical AROM to WNL   Time 8   Period Weeks   Status New     PT LONG TERM GOAL #3   Title pt will report resting neck stiffness as returned to baseline   Time 8   Period Weeks   Status New               Plan - 05/13/16 1404    Clinical Impression Statement patient iwth very weak core and shoulders, very tight neural tension in the UE's, has MRI that reported further testing may be needed for adult onset MD.   PT Next Visit Plan MT, NT and PROM   Consulted and Agree with Plan of Care Patient      Patient will benefit from skilled therapeutic intervention in order to improve the following deficits and impairments:  Decreased range of motion, Decreased activity tolerance, Pain, Increased muscle spasms, Impaired flexibility  Visit Diagnosis: Cervicalgia  Neck pain  Cramp and spasm     Problem List Patient  Active Problem List   Diagnosis Date Noted  . Elevated CK 05/03/2016  . Prediabetes 04/28/2016  . Hepatitis C antibody test positive 04/28/2016  . DOE (dyspnea on exertion) 04/23/2016  . Left hip pain 04/23/2016  . Leg heaviness 04/23/2016  . Right arm pain 04/23/2016  . Arthralgia 04/23/2016  . TMJ arthralgia 05/22/2015  . Hyperlipidemia 12/19/2014  . Essential hypertension 12/19/2014  . Hyperglycemia 12/19/2014  . History of colonic polyps 12/19/2014  . Left lumbar radiculopathy 05/27/2009  . Left foot drop 05/27/2009    Sumner Boast., PT 05/13/2016, 2:12 PM  [address] [place], [place], [address] Phone: [phone]   Fax:  [phone]  Name: Lisa [name] MRN: [id number] Date of Birth: 1955/12/07

## 2016-05-17 ENCOUNTER — Other Ambulatory Visit: Payer: Self-pay | Admitting: Internal Medicine

## 2016-05-18 ENCOUNTER — Ambulatory Visit: Payer: Commercial Managed Care - HMO | Admitting: Physical Therapy

## 2016-05-18 ENCOUNTER — Encounter: Payer: Self-pay | Admitting: Physical Therapy

## 2016-05-18 DIAGNOSIS — M542 Cervicalgia: Secondary | ICD-10-CM

## 2016-05-18 DIAGNOSIS — R252 Cramp and spasm: Secondary | ICD-10-CM

## 2016-05-18 DIAGNOSIS — M436 Torticollis: Secondary | ICD-10-CM

## 2016-05-18 DIAGNOSIS — M79601 Pain in right arm: Secondary | ICD-10-CM

## 2016-05-18 NOTE — Therapy (Signed)
[name], [place], [address] Phone: [phone]   Fax:  [phone]  Physical Therapy Treatment  Patient Details  Name: Lisa [name] MRN: [id number] Date of Birth: 06/28/1955 Referring Provider: Quay Burow  Encounter Date: 05/18/2016      PT End of Session - 05/18/16 1359    Visit Number 6   Date for PT Re-Evaluation 06/25/16   PT Start Time 1306   PT Stop Time 1413   PT Time Calculation (min) 67 min   Activity Tolerance Patient tolerated treatment well;Patient limited by fatigue   Behavior During Therapy [facility] for tasks assessed/performed      Past Medical History:  Diagnosis Date  . Cataract    removed from both eyes  . GERD (gastroesophageal reflux disease)    no issue while on tums  . Heart murmur   . Heavy menses    irregular; on Provera  . Hyperlipidemia    diet controlled  . Hypertension     Past Surgical History:  Procedure Laterality Date  . CATARACT EXTRACTION Bilateral   . CESAREAN SECTION     X2  . COLONOSCOPY  2008   [facility]  . LASER ABLATION CONDYLOMA CERVICAL / VULVAR    . LASIK Bilateral   . LUMBAR DISC SURGERY  2011   L4-5 ; Dr Shellia Carwin  . POLYPECTOMY    . VITRECTOMY Left 01-2015    There were no vitals filed for this visit.      Subjective Assessment - 05/18/16 1315    Subjective Reports that she shopped yesterday and her legs "felt really heavy"  reports pain in the hips an 8/10 after shopping.   Currently in Pain? Yes   Pain Score 4    Pain Location Neck   Pain Orientation Right                         [facility] Adult PT Treatment/Exercise - 05/18/16 0001      Ambulation/Gait   Gait Comments gait x 300 feet wihtout stopping, very winded and tired, especially up slope     Neck Exercises: Machines for Strengthening   UBE (Upper Arm Bike) Level 4 x 4 minutes   Airodyne for Upper Extremity Motion Nustep level 2 x 6 minutes   Other Machines for Strengthening lat pull down 10# needed some assist   Other Machines for Strengthening 15# straight arm pull down with core engagement, leg press 20#     Neck Exercises: Standing   Neck Retraction 20 reps;3 secs     Neck Exercises: Seated   Money 20 reps   Shoulder Shrugs 15 reps   Shoulder Rolls 15 reps   Other Seated Exercise on sit fit pelvic mobility and some stability, then stability with red tband scapular activities with her on the sit fit     Hand Exercises for Cervical Radiculopathy   Other Hand Exercise for Cervical Radiculopathy neural tension in the UE's performed stretches     Electrical Stimulation   Electrical Stimulation Location cervical upper trap area   Electrical Stimulation Action IFC   Electrical Stimulation Parameters sitting   Electrical Stimulation Goals Pain     Manual Therapy   Manual Therapy Neural Stretch   Manual therapy comments STM to the upper traps with vibration                  PT Short Term Goals -  05/07/16 0935      PT SHORT TERM GOAL #1   Title pt will be independent with HEP   Status Achieved           PT Long Term Goals - 05/18/16 1400      PT LONG TERM GOAL #1   Title pt will return to no night pain in the R hand   Status On-going               Plan - 05/18/16 1359    Clinical Impression Statement Again very weak core mms and shoulders, she needs a lot of cues and close supervision due to instability.  Needs work on strength everywhere   PT Next Visit Plan strength and endurance   Consulted and Agree with Plan of Care Patient      Patient will benefit from skilled therapeutic intervention in order to improve the following deficits and impairments:  Decreased range of motion, Decreased activity tolerance, Pain, Increased muscle spasms, Impaired flexibility  Visit Diagnosis: Cervicalgia  Neck pain  Cramp and spasm  Pain in right arm  Neck stiffness     Problem List Patient  Active Problem List   Diagnosis Date Noted  . Elevated CK 05/03/2016  . Prediabetes 04/28/2016  . Hepatitis C antibody test positive 04/28/2016  . DOE (dyspnea on exertion) 04/23/2016  . Left hip pain 04/23/2016  . Leg heaviness 04/23/2016  . Right arm pain 04/23/2016  . Arthralgia 04/23/2016  . TMJ arthralgia 05/22/2015  . Hyperlipidemia 12/19/2014  . Essential hypertension 12/19/2014  . Hyperglycemia 12/19/2014  . History of colonic polyps 12/19/2014  . Left lumbar radiculopathy 05/27/2009  . Left foot drop 05/27/2009    Sumner Boast., PT 05/18/2016, 2:01 PM  [address] [place] [place] [address] [place], [place], [address] Phone: [phone]   Fax:  [phone]  Name: Lisa [name] MRN: [id number] Date of Birth: May 30, 1955

## 2016-05-20 ENCOUNTER — Ambulatory Visit: Payer: Commercial Managed Care - HMO | Attending: Internal Medicine | Admitting: Physical Therapy

## 2016-05-20 ENCOUNTER — Encounter: Payer: Self-pay | Admitting: Physical Therapy

## 2016-05-20 DIAGNOSIS — M79601 Pain in right arm: Secondary | ICD-10-CM

## 2016-05-20 DIAGNOSIS — R252 Cramp and spasm: Secondary | ICD-10-CM | POA: Diagnosis not present

## 2016-05-20 DIAGNOSIS — M436 Torticollis: Secondary | ICD-10-CM

## 2016-05-20 DIAGNOSIS — M542 Cervicalgia: Secondary | ICD-10-CM | POA: Diagnosis present

## 2016-05-20 NOTE — Therapy (Signed)
[place] [address], [place], [address] Phone: [phone]   Fax:  [phone]  Physical Therapy Treatment  Patient Details  Name: Lisa [name] MRN: [id number] Date of Birth: 1955-12-29 Referring Provider: Quay Burow  Encounter Date: 05/20/2016      PT End of Session - 05/20/16 1433    Visit Number 7   Date for PT Re-Evaluation 06/25/16   PT Start Time 1345   PT Stop Time 1444   PT Time Calculation (min) 59 min   Activity Tolerance Patient tolerated treatment well   Behavior During Therapy [facility] for tasks assessed/performed      Past Medical History:  Diagnosis Date  . Cataract    removed from both eyes  . GERD (gastroesophageal reflux disease)    no issue while on tums  . Heart murmur   . Heavy menses    irregular; on Provera  . Hyperlipidemia    diet controlled  . Hypertension     Past Surgical History:  Procedure Laterality Date  . CATARACT EXTRACTION Bilateral   . CESAREAN SECTION     X2  . COLONOSCOPY  2008   [facility]  . LASER ABLATION CONDYLOMA CERVICAL / VULVAR    . LASIK Bilateral   . LUMBAR DISC SURGERY  2011   L4-5 ; Dr Shellia Carwin  . POLYPECTOMY    . VITRECTOMY Left 01-2015    There were no vitals filed for this visit.      Subjective Assessment - 05/20/16 1345    Subjective "A little tight in my neck, not in any pain'   Pain Score 0-No pain                         [facility] Adult PT Treatment/Exercise - 05/20/16 0001      Neck Exercises: Machines for Strengthening   UBE (Upper Arm Bike) Level 4 x 4 minutes   Airodyne for Upper Extremity Motion Nustep level 4 x 4 minutes   Cybex Row 15# 2x10   Other Machines for Strengthening lat pull down 10# needed some assist   Other Machines for Strengthening 15# straight arm pull down with core engagement, leg press 20# 2x10      Neck Exercises: Standing   Neck Retraction 20 reps;3 secs   Other Standing  Exercises Shoulder ER yellow band 2x10    Other Standing Exercises Shoulder ext red tband 2x10      Neck Exercises: Seated   Other Seated Exercise Seated iso holds with ball 2x10      Electrical Stimulation   Electrical Stimulation Location cervical upper trap area   Electrical Stimulation Action IFC   Electrical Stimulation Parameters sitting      Manual Therapy   Manual therapy comments STM to the upper traps with vibration                  PT Short Term Goals - 05/07/16 0935      PT SHORT TERM GOAL #1   Title pt will be independent with HEP   Status Achieved           PT Long Term Goals - 05/18/16 1400      PT LONG TERM GOAL #1   Title pt will return to no night pain in the R hand   Status On-going               Plan - 05/20/16  1433    Clinical Impression Statement Pt enters clinic reporting R shoulder burning at night when she sleeps. Pt is very weak in core and shoulders. Very little ROM achieved with standing shoulder ER and extensions.    Rehab Potential Excellent   PT Frequency 2x / week   PT Duration 8 weeks   PT Treatment/Interventions Electrical Stimulation;Moist Heat;Traction;Therapeutic activities;Therapeutic exercise;Manual techniques;Taping   PT Next Visit Plan strength and endurance      Patient will benefit from skilled therapeutic intervention in order to improve the following deficits and impairments:  Decreased range of motion, Decreased activity tolerance, Pain, Increased muscle spasms, Impaired flexibility  Visit Diagnosis: Cervicalgia  Neck pain  Cramp and spasm  Pain in right arm  Neck stiffness     Problem List Patient Active Problem List   Diagnosis Date Noted  . Elevated CK 05/03/2016  . Prediabetes 04/28/2016  . Hepatitis C antibody test positive 04/28/2016  . DOE (dyspnea on exertion) 04/23/2016  . Left hip pain 04/23/2016  . Leg heaviness 04/23/2016  . Right arm pain 04/23/2016  . Arthralgia  04/23/2016  . TMJ arthralgia 05/22/2015  . Hyperlipidemia 12/19/2014  . Essential hypertension 12/19/2014  . Hyperglycemia 12/19/2014  . History of colonic polyps 12/19/2014  . Left lumbar radiculopathy 05/27/2009  . Left foot drop 05/27/2009    Scot Jun, PTA 05/20/2016, 2:37 PM  [address] [place], [place], [address] Phone: [phone]   Fax:  [phone]  Name: AFRAH RYKER MRN: [id number] Date of Birth: 1955/11/09

## 2016-05-24 ENCOUNTER — Ambulatory Visit: Payer: Commercial Managed Care - HMO | Admitting: Physical Therapy

## 2016-05-24 ENCOUNTER — Encounter: Payer: Self-pay | Admitting: Physical Therapy

## 2016-05-24 DIAGNOSIS — M79601 Pain in right arm: Secondary | ICD-10-CM

## 2016-05-24 DIAGNOSIS — M542 Cervicalgia: Secondary | ICD-10-CM

## 2016-05-24 DIAGNOSIS — M436 Torticollis: Secondary | ICD-10-CM

## 2016-05-24 DIAGNOSIS — R252 Cramp and spasm: Secondary | ICD-10-CM

## 2016-05-24 NOTE — Therapy (Signed)
[place], [place], [address] Phone: [phone]   Fax:  [phone]  Physical Therapy Treatment  Patient Details  Name: Lisa [name] MRN: [id number] Date of Birth: 1955-04-25 Referring Provider: Quay Burow  Encounter Date: 05/24/2016      PT End of Session - 05/24/16 1734    Visit Number 8   Date for PT Re-Evaluation 06/25/16   PT Start Time 1651   PT Stop Time 1750   PT Time Calculation (min) 59 min   Activity Tolerance Patient tolerated treatment well   Behavior During Therapy [facility] for tasks assessed/performed      Past Medical History:  Diagnosis Date  . Cataract    removed from both eyes  . GERD (gastroesophageal reflux disease)    no issue while on tums  . Heart murmur   . Heavy menses    irregular; on Provera  . Hyperlipidemia    diet controlled  . Hypertension     Past Surgical History:  Procedure Laterality Date  . CATARACT EXTRACTION Bilateral   . CESAREAN SECTION     X2  . COLONOSCOPY  2008   [facility]  . LASER ABLATION CONDYLOMA CERVICAL / VULVAR    . LASIK Bilateral   . LUMBAR DISC SURGERY  2011   L4-5 ; Dr Shellia Carwin  . POLYPECTOMY    . VITRECTOMY Left 01-2015    There were no vitals filed for this visit.      Subjective Assessment - 05/24/16 1653    Subjective Patient reports that over the weekend she had two good nights without much pain   Currently in Pain? Yes   Pain Score 1    Pain Location Neck   Pain Orientation Right   Pain Descriptors / Indicators Burning                         [facility] Adult PT Treatment/Exercise - 05/24/16 0001      High Level Balance   High Level Balance Comments on airex balance and then ball toss     Neck Exercises: Machines for Strengthening   UBE (Upper Arm Bike) Level 4 x 4 minutes   Airodyne for Upper Extremity Motion Nustep level 5 x 5 minutes   Cybex Row 15# 2x10   Other Machines for  Strengthening lat pull down 15#   Other Machines for Strengthening 15# straight arm pull down with core engagement, leg press 20# 2x10 , 5# obliques on pulleys     Neck Exercises: Seated   Other Seated Exercise seated isometric ball squeeze for abdominals   Other Seated Exercise Seated iso holds with ball 2x10 , weighted ball trunk rotations hip to hip     Moist Heat Therapy   Number Minutes Moist Heat 15 Minutes   Moist Heat Location Cervical     Electrical Stimulation   Electrical Stimulation Location cervical upper trap area   Electrical Stimulation Action IFC   Electrical Stimulation Parameters sitting   Electrical Stimulation Goals Pain     Manual Therapy   Manual therapy comments STM to the upper traps with vibration                  PT Short Term Goals - 05/07/16 0935      PT SHORT TERM GOAL #1   Title pt will be independent with HEP   Status Achieved  PT Long Term Goals - 05/18/16 1400      PT LONG TERM GOAL #1   Title pt will return to no night pain in the R hand   Status On-going               Plan - 05/24/16 1735    Clinical Impression Statement Patient reports two good days over the weekend with less pain and burning, she has difficulty with lateral stability with the obliques on the pulley really causing her to brace, she also needs close supervision on airex due to balance issues   PT Next Visit Plan strength and endurance and balance   Consulted and Agree with Plan of Care Patient      Patient will benefit from skilled therapeutic intervention in order to improve the following deficits and impairments:  Decreased range of motion, Decreased activity tolerance, Pain, Increased muscle spasms, Impaired flexibility  Visit Diagnosis: Cervicalgia  Cramp and spasm  Pain in right arm  Neck stiffness     Problem List Patient Active Problem List   Diagnosis Date Noted  . Elevated CK 05/03/2016  . Prediabetes 04/28/2016   . Hepatitis C antibody test positive 04/28/2016  . DOE (dyspnea on exertion) 04/23/2016  . Left hip pain 04/23/2016  . Leg heaviness 04/23/2016  . Right arm pain 04/23/2016  . Arthralgia 04/23/2016  . TMJ arthralgia 05/22/2015  . Hyperlipidemia 12/19/2014  . Essential hypertension 12/19/2014  . Hyperglycemia 12/19/2014  . History of colonic polyps 12/19/2014  . Left lumbar radiculopathy 05/27/2009  . Left foot drop 05/27/2009    Sumner Boast., PT 05/24/2016, 5:37 PM  [address] [place], [place], [address] Phone: [phone]   Fax:  [phone]  Name: Lisa [name] MRN: [id number] Date of Birth: 03-May-1955

## 2016-05-27 ENCOUNTER — Ambulatory Visit: Payer: Commercial Managed Care - HMO | Admitting: Physical Therapy

## 2016-05-27 ENCOUNTER — Encounter: Payer: Self-pay | Admitting: Physical Therapy

## 2016-05-27 DIAGNOSIS — M542 Cervicalgia: Secondary | ICD-10-CM

## 2016-05-27 DIAGNOSIS — R252 Cramp and spasm: Secondary | ICD-10-CM

## 2016-05-27 DIAGNOSIS — M79601 Pain in right arm: Secondary | ICD-10-CM

## 2016-05-27 DIAGNOSIS — M436 Torticollis: Secondary | ICD-10-CM

## 2016-05-27 NOTE — Therapy (Signed)
[address] [place], [place], [address] Phone: [phone]   Fax:  [phone]  Physical Therapy Treatment  Patient Details  Name: Lisa [name] MRN: [id number] Date of Birth: [birth year] Referring Provider: Quay Burow  Encounter Date: 05/27/2016      PT End of Session - 05/27/16 1532    Visit Number 9   Date for PT Re-Evaluation 06/25/16   PT Start Time 0370   PT Stop Time 1645   PT Time Calculation (min) 60 min      Past Medical History:  Diagnosis Date  . Cataract    removed from both eyes  . GERD (gastroesophageal reflux disease)    no issue while on tums  . Heart murmur   . Heavy menses    irregular; on Provera  . Hyperlipidemia    diet controlled  . Hypertension     Past Surgical History:  Procedure Laterality Date  . CATARACT EXTRACTION Bilateral   . CESAREAN SECTION     X2  . COLONOSCOPY  2008   [facility]  . LASER ABLATION CONDYLOMA CERVICAL / VULVAR    . LASIK Bilateral   . LUMBAR DISC SURGERY  2011   L4-5 ; Dr Shellia Carwin  . POLYPECTOMY    . VITRECTOMY Left 01-2015    There were no vitals filed for this visit.      Subjective Assessment - 05/27/16 1447    Subjective good today   Currently in Pain? No/denies            [facility] PT Assessment - 05/27/16 0001      AROM   Cervical Flexion WFL   Cervical Extension WFL   Cervical - Right Side Bend WFL   Cervical - Left Side Bend decreased 50%   Cervical - Right Rotation WFL   Cervical - Left Rotation decreased 50%                     [facility] Adult PT Treatment/Exercise - 05/27/16 0001      Neck Exercises: Machines for Strengthening   UBE (Upper Arm Bike) Level 4 x 4 minutes   Airodyne for Upper Extremity Motion Nustep level 5 x 6 minutes   Cybex Row 15# 2x10   Other Machines for Strengthening lat pull down 15# 2 sets  blue tband mod sit up 2 sets 10   Other Machines for Strengthening rev fly 20# 2  sets 15  straight arm pull down 15 times 15# with assistance     Neck Exercises: Standing   Other Standing Exercises 3# cane ex 15 times ext and IR     Neck Exercises: Seated   Shoulder Flexion Left;Weights;15 reps   Shoulder Flexion Weights (lbs) 1   Other Seated Exercise chest press 1# 15 times   Other Seated Exercise Seated iso holds with ball 2x10 , weighted ball trunk rotations hip to hip     Moist Heat Therapy   Number Minutes Moist Heat 15 Minutes   Moist Heat Location Cervical     Electrical Stimulation   Electrical Stimulation Location cervical upper trap area   Electrical Stimulation Action IFC   Electrical Stimulation Parameters sitting   Electrical Stimulation Goals Pain                  PT Short Term Goals - 05/07/16 0935      PT SHORT TERM GOAL #1   Title pt will be  independent with HEP   Status Achieved           PT Long Term Goals - 05/27/16 1526      PT LONG TERM GOAL #1   Title pt will return to no night pain in the R hand   Status Partially Met     PT LONG TERM GOAL #2   Title pt will improve cervical AROM to WNL   Status Partially Met     PT LONG TERM GOAL #3   Title pt will report resting neck stiffness as returned to baseline   Status Partially Met               Plan - 05/27/16 1533    Clinical Impression Statement good improvements in cerv ROM and decreased pain but Left UE and core are very weak   PT Next Visit Plan strength and endurance and balance      Patient will benefit from skilled therapeutic intervention in order to improve the following deficits and impairments:  Decreased range of motion, Decreased activity tolerance, Pain, Increased muscle spasms, Impaired flexibility  Visit Diagnosis: Cervicalgia  Cramp and spasm  Neck stiffness  Neck pain  Pain in right arm     Problem List Patient Active Problem List   Diagnosis Date Noted  . Elevated CK 05/03/2016  . Prediabetes 04/28/2016  .  Hepatitis C antibody test positive 04/28/2016  . DOE (dyspnea on exertion) 04/23/2016  . Left hip pain 04/23/2016  . Leg heaviness 04/23/2016  . Right arm pain 04/23/2016  . Arthralgia 04/23/2016  . TMJ arthralgia 05/22/2015  . Hyperlipidemia 12/19/2014  . Essential hypertension 12/19/2014  . Hyperglycemia 12/19/2014  . History of colonic polyps 12/19/2014  . Left lumbar radiculopathy 05/27/2009  . Left foot drop 05/27/2009    PAYSEUR,ANGIE PTA 05/27/2016, 3:34 PM  [address] [place], [place], [address] Phone: [phone]   Fax:  [phone]  Name: AHANA NAJERA MRN: [id number] Date of Birth: 12-18-1955

## 2016-05-31 ENCOUNTER — Ambulatory Visit: Payer: Commercial Managed Care - HMO | Admitting: Physical Therapy

## 2016-06-03 ENCOUNTER — Ambulatory Visit: Payer: Commercial Managed Care - HMO | Admitting: Physical Therapy

## 2016-06-03 ENCOUNTER — Encounter: Payer: Self-pay | Admitting: Physical Therapy

## 2016-06-03 DIAGNOSIS — M436 Torticollis: Secondary | ICD-10-CM

## 2016-06-03 DIAGNOSIS — M542 Cervicalgia: Secondary | ICD-10-CM

## 2016-06-03 DIAGNOSIS — R252 Cramp and spasm: Secondary | ICD-10-CM

## 2016-06-03 NOTE — Therapy (Signed)
[address] [place], [place], [address] Phone: [phone]   Fax:  [phone]  Physical Therapy Treatment  Patient Details  Name: Lisa [name] MRN: [id number] Date of Birth: [birth year] Referring Provider: Quay Burow  Encounter Date: 06/03/2016      PT End of Session - 06/03/16 1610    Visit Number 10   Date for PT Re-Evaluation 06/25/16   PT Start Time 1520   PT Stop Time 1616   PT Time Calculation (min) 56 min   Activity Tolerance Patient tolerated treatment well   Behavior During Therapy [facility] for tasks assessed/performed      Past Medical History:  Diagnosis Date  . Cataract    removed from both eyes  . GERD (gastroesophageal reflux disease)    no issue while on tums  . Heart murmur   . Heavy menses    irregular; on Provera  . Hyperlipidemia    diet controlled  . Hypertension     Past Surgical History:  Procedure Laterality Date  . CATARACT EXTRACTION Bilateral   . CESAREAN SECTION     X2  . COLONOSCOPY  2008   [facility]  . LASER ABLATION CONDYLOMA CERVICAL / VULVAR    . LASIK Bilateral   . LUMBAR DISC SURGERY  2011   L4-5 ; Dr Shellia Carwin  . POLYPECTOMY    . VITRECTOMY Left 01-2015    There were no vitals filed for this visit.      Subjective Assessment - 06/03/16 1523    Subjective Reports that she is having less shoulder and neck pain, having right hand burning at night.  Reports that the last two days her right hip has been hurting anteriorly   Currently in Pain? Yes   Pain Score 7    Pain Location Hip   Pain Orientation Right;Anterior   Aggravating Factors  hip flexion, getting in and out pf car                         [facility] Adult PT Treatment/Exercise - 06/03/16 0001      Neck Exercises: Machines for Strengthening   UBE (Upper Arm Bike) Level 4 x 4 minutes   [employer] for Upper Extremity Motion Nustep level 5 x 6 minutes   Other Machines for  Strengthening lat pull down 15# 2 sets   Other Machines for Strengthening 15# straight arm pull down, standing 15# pulls     Neck Exercises: Seated   Other Seated Exercise standing 2# hip abduction and flexion holding onto the walker, seated hip ER with red tband, IR with yellow tband   Other Seated Exercise seated airex with mobility and stability for pelvic/lumbar     Moist Heat Therapy   Number Minutes Moist Heat 15 Minutes   Moist Heat Location Hip     Electrical Stimulation   Electrical Stimulation Location right anterior hip   Electrical Stimulation Action IFC   Electrical Stimulation Parameters supine   Electrical Stimulation Goals Pain                  PT Short Term Goals - 05/07/16 0935      PT SHORT TERM GOAL #1   Title pt will be independent with HEP   Status Achieved           PT Long Term Goals - 06/03/16 1613      PT LONG TERM GOAL #1  Title pt will return to no night pain in the R hand   Status Partially Met     PT LONG TERM GOAL #2   Title pt will improve cervical AROM to WNL   Status Partially Met               Plan - 06/03/16 1611    Clinical Impression Statement Patient with some pain in the right anterior hip over the past few days, she is unsure of a cause.  Reports difficulty lifting leg to get into the car, very unsteady on the sit/fit, could not do marching in sitting   PT Next Visit Plan strength and endurance and balance   Consulted and Agree with Plan of Care Patient      Patient will benefit from skilled therapeutic intervention in order to improve the following deficits and impairments:  Decreased range of motion, Decreased activity tolerance, Pain, Increased muscle spasms, Impaired flexibility  Visit Diagnosis: Cervicalgia  Cramp and spasm  Neck stiffness     Problem List Patient Active Problem List   Diagnosis Date Noted  . Elevated CK 05/03/2016  . Prediabetes 04/28/2016  . Hepatitis C antibody test  positive 04/28/2016  . DOE (dyspnea on exertion) 04/23/2016  . Left hip pain 04/23/2016  . Leg heaviness 04/23/2016  . Right arm pain 04/23/2016  . Arthralgia 04/23/2016  . TMJ arthralgia 05/22/2015  . Hyperlipidemia 12/19/2014  . Essential hypertension 12/19/2014  . Hyperglycemia 12/19/2014  . History of colonic polyps 12/19/2014  . Left lumbar radiculopathy 05/27/2009  . Left foot drop 05/27/2009    Sumner Boast., PT 06/03/2016, 4:14 PM  [address] [place], [place], [address] Phone: [phone]   Fax:  [phone]  Name: Lisa [name] MRN: [id number] Date of Birth: December 11, 1955

## 2016-06-07 DIAGNOSIS — G5601 Carpal tunnel syndrome, right upper limb: Secondary | ICD-10-CM | POA: Diagnosis not present

## 2016-06-07 DIAGNOSIS — G710 Muscular dystrophy: Secondary | ICD-10-CM | POA: Diagnosis not present

## 2016-06-07 DIAGNOSIS — G959 Disease of spinal cord, unspecified: Secondary | ICD-10-CM | POA: Diagnosis not present

## 2016-06-07 DIAGNOSIS — M25551 Pain in right hip: Secondary | ICD-10-CM | POA: Diagnosis not present

## 2016-06-07 DIAGNOSIS — M5416 Radiculopathy, lumbar region: Secondary | ICD-10-CM | POA: Diagnosis not present

## 2016-06-18 ENCOUNTER — Other Ambulatory Visit: Payer: Self-pay | Admitting: Neurosurgery

## 2016-06-18 DIAGNOSIS — R29898 Other symptoms and signs involving the musculoskeletal system: Secondary | ICD-10-CM

## 2016-06-21 ENCOUNTER — Encounter: Payer: Self-pay | Admitting: Neurology

## 2016-06-21 ENCOUNTER — Ambulatory Visit (INDEPENDENT_AMBULATORY_CARE_PROVIDER_SITE_OTHER): Payer: Commercial Managed Care - HMO | Admitting: Neurology

## 2016-06-21 VITALS — BP 120/70 | HR 86 | Ht 67.0 in | Wt 229.0 lb

## 2016-06-21 DIAGNOSIS — G7100 Muscular dystrophy, unspecified: Secondary | ICD-10-CM

## 2016-06-21 NOTE — Patient Instructions (Addendum)
1.  Start physical therapy 2.  Handicap placard 3.  We will contact you regarding genetic testing for muscular dystrophy (fascioscapulohumeral dystrophy vs limb-girdle muscular dystrophy).  If you get any clarification on the type that your family members have, please call my office with this information 4.  If you choose to proceed with nerve conduction testing and electromyography, call the office the schedule  Return to clinic in 6-8 weeks

## 2016-06-21 NOTE — Progress Notes (Signed)
[name]   Date: 06/21/16  Lisa [name] MRN: [id number] DOB: [birth year]   Dear Dr. Quay Burow:  Thank you for your kind referral of Lisa [name] for consultation of muscular dystrophy. Although her history is well known to you, please allow [place] to reiterate it for the purpose of our medical record. The patient was accompanied to the clinic by self.    History of Present Illness: Lisa [name] is a 61 y.o. right-handed Caucasian female with hyperlipidemia, hypertension, GERD, s/p lumbar lamintactomy L5-S1 (2006) with residual left foot drop  presenting for evaluation of muscular dystrophy.    She had lumbar decompression for left radicular pain in 2006.  Following surgery, her pain subsided but several months later, she developed left foot drop.  She saw Dr. Krista Blue at [facility] who performed NCS/EMG whose testing suggested her foot drop was stemming from her back.  She tried using AFO initially, but did not find it comfortable, so stopped using it.  She has been walking unassisted for several years.  Since 2017, she had increased heaviness of the legs and left throbbing pain over the left hip which does not radiate down her leg.   She had difficulty climbing stairs, getting up from low chairs, and squatting.  Over the past several months, she also complains of right hip throbbing pain.  For many years, she has been unable to raise her left arm with arm extension and abduction.  She has noticed asymmetry of the shoulder on the left where it tends to be lower.  She recalls always noticing that her hand bag would slip off her shoulder when she would place it on the left shoulder.   She also complains of tingling and burning pain over the right index finger, which is worse at night time. She feels that certain neck position trigger tingling of the hand.  She went to physical therapy for this but did not find much improvement.  She has  been using a wrist splint at night time.   She denies difficulty swallowing or talking, cramps, muscle twitches, droopy eyelids, or muscle pain.  Her maternal uncle had wasting of the arms and legs and was told he has "muscle disease."  Her mother's cousins were diagnosed with muscular dystrophy.  In her 47s, she recalls being at a family reunion where students from [employer] where examining her family members for signs of fascioscapulohumeral dystrophy.   She has two grown children who do not have weakness.  Her daughter has started to complain of her handbag slipping off her arm, which concerns patient.   She had MRI lumbar spine which shows L4-5 facet arthropathy and L5-S1 chronic foraminal stenosis as well as impressive fatty infiltration of the back muscles, suggestive of adult onset muscular dystrophy for which patient was referred to see me.  Out-side paper records, electronic medical record, and images have been reviewed where available and summarized as:  Component     Latest Ref Rng & Units 04/23/2016 04/30/2016  CK Total     7 - 177 U/L 232 (H) 183 (H)   Labs 04/23/2016:  ESR 26, CRP 0.3, ANA neg, TSH 1.47, HbA1c 5.8 Labs 04/30/2016:  Aldolase 11.1*  MRI 05/08/2016: 1. L4-5 severe facet arthropathy with gaping joints. Chronic moderate right foraminal narrowing at this level.  2. L5-S1 chronic moderate left foraminal stenosis, primarily discogenic. Good canal patency after interval posterior decompression. 3. Remarkable and progressive fatty infiltration of intrinsic  back muscles and quadratus lumborum - to a degree that adult onset muscular dystrophy should be considered.   Past Medical History:  Diagnosis Date  . Cataract    removed from both eyes  . GERD (gastroesophageal reflux disease)    no issue while on tums  . Heart murmur   . Heavy menses    irregular; on Provera  . Hyperlipidemia    diet controlled  . Hypertension     Past Surgical History:  Procedure Laterality Date  .  CATARACT EXTRACTION Bilateral   . CESAREAN SECTION     X2  . COLONOSCOPY  2008   [facility]  . LASER ABLATION CONDYLOMA CERVICAL / VULVAR    . LASIK Bilateral   . LUMBAR DISC SURGERY  2011   L4-5 ; Dr Shellia Carwin  . POLYPECTOMY    . VITRECTOMY Left 01-2015     Medications:  Outpatient Encounter Prescriptions as of 06/21/2016  Medication Sig Note  . aspirin (ASPIRIN EC) 81 MG EC tablet Take 81 mg by mouth daily. Swallow whole.   . calcium carbonate (TUMS - DOSED IN MG ELEMENTAL CALCIUM) 500 MG chewable tablet Chew 1 tablet by mouth as needed for indigestion or heartburn.   . hydrochlorothiazide (HYDRODIURIL) 25 MG tablet TAKE 1 TABLET(25 MG) BY MOUTH DAILY   . losartan (COZAAR) 100 MG tablet Take 1 tablet (100 mg total) by mouth daily.   . Magnesium 250 MG TABS Take by mouth daily.   . Multiple Vitamin (MULTI VITAMIN DAILY PO) Take by mouth.   . Omega-3 Fatty Acids (FISH OIL) 1200 MG CPDR Take 1,200 mg by mouth daily.   . SOOLANTRA 1 % CREA APP AA ON THE FACE QD 05/22/2015: Received from: External Pharmacy  . Vitamin D, Cholecalciferol, 1000 UNITS TABS Take 1,000 Units by mouth daily.   Marland Kitchen atorvastatin (LIPITOR) 20 MG tablet Take 1 tablet (20 mg total) by mouth daily. (Patient not taking: Reported on 06/21/2016)    No facility-administered encounter medications on file as of 06/21/2016.      Allergies:  Allergies  Allergen Reactions  . Lisinopril Cough    Family History: Family History  Problem Relation Age of Onset  . Thyroid cancer Mother   . Hypertension Mother   . Colon polyps Father   . Hyperlipidemia Father   . Atrial fibrillation Father     has PPM  . Diabetes Paternal Uncle   . Diabetes Paternal Grandmother   . Heart attack Paternal Grandmother     <65  . Stroke Paternal Grandfather   . Hypertension Sister   . Hyperlipidemia Sister   . Muscular dystrophy Cousin   . Colon cancer Neg Hx   . Esophageal cancer Neg Hx   . Rectal cancer Neg Hx   . Stomach cancer Neg Hx     Maternal side of the family has "muscle disease" and 2 cousins were diagnosed with muscular dystrophy.   Social History: Social History  Substance Use Topics  . Smoking status: Never Smoker  . Smokeless tobacco: Never Used  . Alcohol use No   Social History   Social History Narrative   Lives with husband in a 2 story home.  Has 2 children.  Retired Print production planner.  Education: college degree.     Review of Systems:  CONSTITUTIONAL: No fevers, chills, night sweats, or weight loss.   EYES: No visual changes or eye pain ENT: No hearing changes.  No history of nose bleeds.   RESPIRATORY: No cough,  wheezing and shortness of breath.   CARDIOVASCULAR: Negative for chest pain, and palpitations.   GI: Negative for abdominal discomfort, blood in stools or black stools.  No recent change in bowel habits.   GU:  No history of incontinence.   MUSCLOSKELETAL: +history of joint pain or swelling.  No myalgias.   SKIN: Negative for lesions, rash, and itching.   HEMATOLOGY/ONCOLOGY: Negative for prolonged bleeding, bruising easily, and swollen nodes.  No history of cancer.   ENDOCRINE: Negative for cold or heat intolerance, polydipsia or goiter.   PSYCH:  No depression or anxiety symptoms.   NEURO: As Above.   Vital Signs:  BP 120/70   Pulse 86   Ht 5' 7" (1.702 m)   Wt 229 lb (103.9 kg)   LMP 09/30/2014   SpO2 96%   BMI 35.87 kg/m    General Medical Exam:   General:  Well appearing, comfortable.   Eyes/ENT: see cranial nerve examination.   Neck: No masses appreciated.  Full range of motion without tenderness.  No carotid bruits. Respiratory:  Clear to auscultation, good air entry bilaterally.   Cardiac:  Regular rate and rhythm, no murmur.   Extremities:  No deformities, edema, or skin discoloration.  Skin:  No rashes or lesions.  Neurological Exam: MENTAL STATUS including orientation to time, place, person, recent and remote memory, attention span and concentration,  language, and fund of knowledge is normal.  Speech is not dysarthric.  No atypical facies or muscle atrophy.  CRANIAL NERVES: II:  No visual field defects.  Unremarkable fundi.   III-IV-VI: Pupils equal round and reactive to light.  Normal conjugate, extra-ocular eye movements in all directions of gaze.  No nystagmus.  No ptosis at baseline or with sustained upgaze.   V:  Normal facial sensation.   VII:  Normal facial symmetry and movements.  Facial muscles are 5/5, including orbicularis oculi, buccinator, and orbicular oris. VIII:  Normal hearing and vestibular function.   IX-X:  Normal palatal movement.   XI:  Normal shoulder shrug and head rotation.   XII:  Normal tongue strength and range of motion, no deviation or fasciculation.  MOTOR:  There is left periscapular atrophy and left shoulder sits lower as compared to the right.  There are no fasciculations or abnormal movements.  No pronator drift.  Tone is normal.  No percussion or grip myotonia.  Right Upper Extremity:    Left Upper Extremity:    Deltoid  4+/5   Deltoid  4/5   Biceps  5/5   Biceps  5-/5   Triceps  5/5   Triceps  4/5   Medial pectoralis 5-/5  Medial pectoralis 4/5  Infraspinatus 5/5  Infraspinatus  4/5  Wrist extensors  5/5   Wrist extensors  5/5   Wrist flexors  5/5   Wrist flexors  5/5   Finger extensors  5/5   Finger extensors  5/5   Finger flexors  5/5   Finger flexors  5/5   Dorsal interossei  5/5   Dorsal interossei  5/5   Abductor pollicis  5/5   Abductor pollicis  5/5   Tone (Ashworth scale)  0  Tone (Ashworth scale)  0   Right Lower Extremity:    Left Lower Extremity:    Hip flexors  4+/5   Hip flexors  4/5   Hip extensors  5/5   Hip extensors  5/5   Adductor 5/5  Adductor 5/5  Abductor 4+/5  Abductor 4/5  Knee  flexors  5/5   Knee flexors  5/5   Knee extensors  5/5   Knee extensors  5/5   Dorsiflexors  5/5   Dorsiflexors  4+/5   Plantarflexors  5/5   Plantarflexors  5/5   Toe extensors  5/5   Toe  extensors  5-/5   Toe flexors  5/5   Toe flexors  5/5   Tone (Ashworth scale)  0  Tone (Ashworth scale)  0   MSRs:  Right                                                                 Left brachioradialis 2+  brachioradialis 2+  biceps 2+  biceps 2+  triceps 2+  triceps 2+  patellar 2+  patellar 2+  ankle jerk 2+  ankle jerk 2+  Hoffman no  Hoffman no  plantar response down  plantar response down   SENSORY:  Normal and symmetric perception of light touch, pinprick, vibration, and proprioception.    COORDINATION/GAIT: Normal finger-to- nose-finger.  Intact rapid alternating movements bilaterally.  Able to rise from a chair without using arms, but takes effort.  Gait shows mild dragging of the left foot and waddle quality with poor hip flexion bilaterally.     IMPRESSION: Mrs. Pletz is a 61 year-old female presenting for evaluation of progressive weakness of the proximal left upper extremity and bilateral lower legs.  Her mild elevation in CK and MRI findings shows fatty infiltration of the back muscles along with her exam showing asymmetrical limb-girdle pattern of weakness and family history, certainly raises the likelihood of muscular dystrophy. Her family history is suggestive of fascioscapulohumeral muscular dystrophy, however she does not have facial weakness on exam, making limb-girdle muscular dystrophy, much more likely.  Ultimately, genetic testing will most accurately diagnose the type of dystrophy.  Although there is a role for NCS/EMG as to determine the severity and extent of muscle involvement, it may not change that she will still need genetic testing.  She also does not wish to pursue NCS/EMG due to intolerance of testing in the past, so it was decided to hold on this for now.  In the meantime, I will refer her to physical therapy for bilateral leg and left arm strengthening.  She will also inquire with her extended family about the specific type of muscular dystrophy and if  she is able to get this information, we will send for single gene mutation, otherwise, we will order muscular dystrophy panel through [facility] (test ID [facility] _[id number]).  Handicap placard form was completed. I had an extensive discussion regarding possible diagnosis, work-up, and management options.  Unfortunately, there is no curative options for muscular dystrophy and management is supportive.   Return to clinic in 6-8 weeks  The duration of this appointment visit was 70 minutes of face-to-face time with the patient.  Greater than 50% of this time was spent in counseling, explanation of diagnosis, planning of further management, and coordination of care.   Thank you for allowing me to participate in patient's care.  If I can answer any additional questions, I would be pleased to do so.    Sincerely,     K. Posey Pronto, DO

## 2016-06-23 ENCOUNTER — Telehealth: Payer: Self-pay | Admitting: Neurology

## 2016-06-23 NOTE — Telephone Encounter (Signed)
Called patient back and gave her the number to call and the test code.  She will call back to let me know the cost of the test.

## 2016-06-23 NOTE — Telephone Encounter (Signed)
Patient wants tot speak to the nurse about some information that Dr Posey Pronto need to know

## 2016-06-27 ENCOUNTER — Ambulatory Visit
Admission: RE | Admit: 2016-06-27 | Discharge: 2016-06-27 | Disposition: A | Discharge status: Home / Self Care | Payer: Commercial Managed Care - HMO | Source: Ambulatory Visit | Attending: Neurosurgery | Admitting: Neurosurgery

## 2016-06-27 DIAGNOSIS — R29898 Other symptoms and signs involving the musculoskeletal system: Secondary | ICD-10-CM

## 2016-06-27 DIAGNOSIS — M4802 Spinal stenosis, cervical region: Secondary | ICD-10-CM | POA: Diagnosis not present

## 2016-06-28 ENCOUNTER — Encounter: Payer: Self-pay | Admitting: Neurology

## 2016-06-28 ENCOUNTER — Telehealth: Payer: Self-pay | Admitting: Neurology

## 2016-06-28 ENCOUNTER — Other Ambulatory Visit: Payer: Self-pay | Admitting: *Deleted

## 2016-06-28 DIAGNOSIS — M21372 Foot drop, left foot: Secondary | ICD-10-CM

## 2016-06-28 DIAGNOSIS — M5416 Radiculopathy, lumbar region: Secondary | ICD-10-CM

## 2016-06-28 NOTE — Telephone Encounter (Signed)
Order placed

## 2016-06-30 DIAGNOSIS — M25552 Pain in left hip: Secondary | ICD-10-CM | POA: Diagnosis not present

## 2016-06-30 DIAGNOSIS — G5601 Carpal tunnel syndrome, right upper limb: Secondary | ICD-10-CM | POA: Diagnosis not present

## 2016-06-30 DIAGNOSIS — G710 Muscular dystrophy: Secondary | ICD-10-CM | POA: Diagnosis not present

## 2016-07-01 ENCOUNTER — Telehealth: Payer: Self-pay | Admitting: Neurology

## 2016-07-01 NOTE — Telephone Encounter (Signed)
Patient will come in tomorrow for lab work.

## 2016-07-01 NOTE — Telephone Encounter (Signed)
Caller: Lisa [name]  Urgent? No  Reason for the call: Regarding your last phone conversation. Regarding insurance? Thanks

## 2016-07-02 ENCOUNTER — Other Ambulatory Visit: Payer: Commercial Managed Care - HMO

## 2016-07-06 ENCOUNTER — Encounter: Payer: Self-pay | Admitting: Physical Therapy

## 2016-07-06 ENCOUNTER — Ambulatory Visit: Payer: Commercial Managed Care - HMO | Attending: Internal Medicine | Admitting: Physical Therapy

## 2016-07-06 ENCOUNTER — Other Ambulatory Visit: Payer: Commercial Managed Care - HMO

## 2016-07-06 DIAGNOSIS — M25552 Pain in left hip: Secondary | ICD-10-CM | POA: Insufficient documentation

## 2016-07-06 DIAGNOSIS — M6281 Muscle weakness (generalized): Secondary | ICD-10-CM | POA: Diagnosis present

## 2016-07-06 DIAGNOSIS — M25551 Pain in right hip: Secondary | ICD-10-CM | POA: Diagnosis not present

## 2016-07-06 DIAGNOSIS — G710 Muscular dystrophy: Secondary | ICD-10-CM | POA: Diagnosis not present

## 2016-07-06 DIAGNOSIS — R262 Difficulty in walking, not elsewhere classified: Secondary | ICD-10-CM | POA: Insufficient documentation

## 2016-07-06 NOTE — Therapy (Signed)
[place], [place], [address] Phone: [phone]   Fax:  [phone]  Physical Therapy Evaluation  Patient Details  Name: Lisa [name] MRN: [id number] Date of Birth: March 01, 1956 Referring Provider: Narda Amber  Encounter Date: 07/06/2016      PT End of Session - 07/06/16 1134    Visit Number 1   Date for PT Re-Evaluation 09/05/16   PT Start Time 1100   PT Stop Time 1152   PT Time Calculation (min) 52 min   Activity Tolerance Patient tolerated treatment well   Behavior During Therapy [facility] for tasks assessed/performed      Past Medical History:  Diagnosis Date  . Cataract    removed from both eyes  . GERD (gastroesophageal reflux disease)    no issue while on tums  . Heart murmur   . Heavy menses    irregular; on Provera  . Hyperlipidemia    diet controlled  . Hypertension     Past Surgical History:  Procedure Laterality Date  . CATARACT EXTRACTION Bilateral   . CESAREAN SECTION     X2  . COLONOSCOPY  2008   [facility]  . LASER ABLATION CONDYLOMA CERVICAL / VULVAR    . LASIK Bilateral   . LUMBAR DISC SURGERY  2011   L4-5 ; Dr Shellia Carwin  . POLYPECTOMY    . VITRECTOMY Left 01-2015    There were no vitals filed for this visit.       Subjective Assessment - 07/06/16 1105    Subjective Pateint was seen here about 2 months ago for neck and arm pain, she is referred back to [place] due to a recent flare up in hip pain, bilaterally but left is worse.  Reports more difficulty recently with walking, getting in and out of the car and in and out of the bed.   Limitations Standing;Walking;House hold activities   Diagnostic tests xray ; Degenerative disc disease is noted at C5-6 and C6-7, with mild spurring and stenosis   Patient Stated Goals move easier, walk better have less pain   Currently in Pain? Yes   Pain Score 2    Pain Location Hip   Pain Orientation Left;Right;Anterior    Pain Descriptors / Indicators Burning;Sharp   Pain Type Chronic pain   Pain Onset More than a month ago   Pain Frequency Intermittent   Aggravating Factors  standing and walking, shopping the pain will go up to 9/10, in and out of the car the pain is a 6/10   Pain Relieving Factors unsure of any thing that helps, sitting in a recliner will decrease the pain to 0/10 after time   Effect of Pain on Daily Activities difficulty with all ADL's            [facility] PT Assessment - 07/06/16 0001      Assessment   Medical Diagnosis hip and back pain   Referring Provider Narda Amber   Onset Date/Surgical Date 06/05/16   Hand Dominance Right   Prior Therapy yes     Precautions   Precautions None     Balance Screen   Has the patient fallen in the past 6 months No   Has the patient had a decrease in activity level because of a fear of falling?  No   Is the patient reluctant to leave their home because of a fear of falling?  No     Home Environment  Additional Comments has stairs, has to use them one at a time, lead with left going up.  does light housework and cooks     Prior Function   Level of Independence Independent   Vocation Retired   Leisure no exercise     Mining engineer Comments rounded shoulders, fwd head, decreased lordosis     AROM   AROM Assessment Site Hip   Right/Left Hip Right;Left   Right Hip Flexion 40   Right Hip External Rotation  15   Right Hip ABduction 20   Left Hip Flexion 40   Left Hip External Rotation  15   Left Hip ABduction 20     Strength   Overall Strength Comments hips 3+/5 in the available ROM, knees 4/5, right ankle 4-/5, left ankle 3/5     Flexibility   Soft Tissue Assessment /Muscle Length --  tight HS, calf, piriformis, quad and hip flexor     Palpation   Palpation comment There is some rigidity of the hip flexor and quad mms as well as the adductors this could be due to being so weak in the core that these mms  are compensating soo much.  She was only mildly tender in these area, very weak abdominals     Ambulation/Gait   Gait Comments gait is without assistive device, slow uses hands on her front thig to support, has difficulty with moving the legs                   [facility] Adult PT Treatment/Exercise - 07/06/16 0001      Moist Heat Therapy   Number Minutes Moist Heat 15 Minutes   Moist Heat Location Hip     Electrical Stimulation   Electrical Stimulation Location anterior hips   Electrical Stimulation Action premod   Electrical Stimulation Parameters supine   Electrical Stimulation Goals Pain                  PT Short Term Goals - 07/06/16 1139      PT SHORT TERM GOAL #1   Title pt will be independent with HEP   Time 2   Period Weeks   Status New           PT Long Term Goals - 07/06/16 1139      PT LONG TERM GOAL #1   Title increase AROM of the hips to 80 dergrees in standing   Time 8   Period Weeks   Status New     PT LONG TERM GOAL #2   Title go up and down stairs step over step   Time 8   Period Weeks   Status New     PT LONG TERM GOAL #3   Title walk 300 feet without rest   Time 8   Period Weeks   Status New     PT LONG TERM GOAL #4   Title decrease pain in the hips 25%   Time 8   Period Weeks   Status New     PT LONG TERM GOAL #5   Title be able to get in and out of the car without using the hands to lift legs   Time 8   Period Weeks   Status New               Plan - 07/06/16 1135    Clinical Impression Statement Patient with long standing weakness of the LE's, she will be tested  for genetic muscular dystrophy today, but the results may take a few weeks.  She has pain inthe anterior hips with standing, walking and getting in and out of the car and the bed.  She is very weak in the hips 3+/5, left ankle is 3/5.  Hip flexion actively in standing is 40 degrees.     Rehab Potential Fair   PT Frequency 2x / week   PT Duration  8 weeks   PT Treatment/Interventions Electrical Stimulation;Moist Heat;Traction;Therapeutic activities;Therapeutic exercise;Manual techniques;Taping   PT Next Visit Plan core strength, quad and hip flexor stretching   Consulted and Agree with Plan of Care Patient      Patient will benefit from skilled therapeutic intervention in order to improve the following deficits and impairments:  Decreased range of motion, Decreased activity tolerance, Pain, Increased muscle spasms, Impaired flexibility  Visit Diagnosis: Pain in left hip - Plan: PT plan of care cert/re-cert  Pain in right hip - Plan: PT plan of care cert/re-cert  Difficulty in walking, not elsewhere classified - Plan: PT plan of care cert/re-cert  Muscle weakness (generalized) - Plan: PT plan of care cert/re-cert     Problem List Patient Active Problem List   Diagnosis Date Noted  . Elevated CK 05/03/2016  . Prediabetes 04/28/2016  . Hepatitis C antibody test positive 04/28/2016  . DOE (dyspnea on exertion) 04/23/2016  . Left hip pain 04/23/2016  . Leg heaviness 04/23/2016  . Right arm pain 04/23/2016  . Arthralgia 04/23/2016  . TMJ arthralgia 05/22/2015  . Hyperlipidemia 12/19/2014  . Essential hypertension 12/19/2014  . Hyperglycemia 12/19/2014  . History of colonic polyps 12/19/2014  . Left lumbar radiculopathy 05/27/2009  . Left foot drop 05/27/2009    Sumner Boast., PT 07/06/2016, 11:48 AM  [address] [place], [place], [address] Phone: [phone]   Fax:  [phone]  Name: Lisa [name] MRN: [id number] Date of Birth: 24-Jun-1955

## 2016-07-08 ENCOUNTER — Encounter: Payer: Self-pay | Admitting: Physical Therapy

## 2016-07-08 ENCOUNTER — Ambulatory Visit: Payer: Commercial Managed Care - HMO | Admitting: Physical Therapy

## 2016-07-08 DIAGNOSIS — M6281 Muscle weakness (generalized): Secondary | ICD-10-CM

## 2016-07-08 DIAGNOSIS — R262 Difficulty in walking, not elsewhere classified: Secondary | ICD-10-CM

## 2016-07-08 DIAGNOSIS — M25552 Pain in left hip: Secondary | ICD-10-CM | POA: Diagnosis not present

## 2016-07-08 DIAGNOSIS — M25551 Pain in right hip: Secondary | ICD-10-CM

## 2016-07-08 NOTE — Therapy (Signed)
[place], [place], [address] Phone: [phone]   Fax:  [phone]  Physical Therapy Treatment  Patient Details  Name: Lisa [name] MRN: [id number] Date of Birth: Jan 16, 1956 Referring Provider: Narda Amber  Encounter Date: 07/08/2016      PT End of Session - 07/08/16 1231    Visit Number 2   Date for PT Re-Evaluation 09/05/16   PT Start Time 1140   PT Stop Time 1240   PT Time Calculation (min) 60 min   Activity Tolerance Patient tolerated treatment well   Behavior During Therapy [facility] for tasks assessed/performed      Past Medical History:  Diagnosis Date  . Cataract    removed from both eyes  . GERD (gastroesophageal reflux disease)    no issue while on tums  . Heart murmur   . Heavy menses    irregular; on Provera  . Hyperlipidemia    diet controlled  . Hypertension     Past Surgical History:  Procedure Laterality Date  . CATARACT EXTRACTION Bilateral   . CESAREAN SECTION     X2  . COLONOSCOPY  2008   [facility]  . LASER ABLATION CONDYLOMA CERVICAL / VULVAR    . LASIK Bilateral   . LUMBAR DISC SURGERY  2011   L4-5 ; Dr Shellia Carwin  . POLYPECTOMY    . VITRECTOMY Left 01-2015    There were no vitals filed for this visit.      Subjective Assessment - 07/08/16 1143    Subjective Patient reported that she is feeling well, and only has pain getting in and out of her car that she would rate a 5/10.    Currently in Pain? No/denies                         [facility] Adult PT Treatment/Exercise - 07/08/16 0001      Exercises   Exercises Knee/Hip     Knee/Hip Exercises: Stretches   Passive Hamstring Stretch Both;3 reps;30 seconds   ITB Stretch Both;3 reps;30 seconds     Knee/Hip Exercises: Aerobic   Nustep L 4 6 min     Knee/Hip Exercises: Standing   Hip Flexion Stengthening;Both;2 sets   Hip Flexion Limitations 3#   Hip Abduction  Stengthening;Both;2 sets;10 reps   Abduction Limitations on airex   Hip Extension Stengthening;Both;2 sets;10 reps   Extension Limitations on airex     Knee/Hip Exercises: Seated   Long Arc Quad Strengthening;Both;3 sets;10 reps;Weights   Long Arc Quad Weight 3 lbs.   Marching Strengthening;Both;10 reps;3 sets     Knee/Hip Exercises: Supine   Bridges Strengthening;Both;2 sets;10 reps   Other Supine Knee/Hip Exercises ham curl red ball 2 x 10, red ball oblique 2 x 10      Moist Heat Therapy   Number Minutes Moist Heat 15 Minutes   Moist Heat Location Hip     Electrical Stimulation   Electrical Stimulation Location anterior hips   Electrical Stimulation Action premod   Electrical Stimulation Parameters supine   Electrical Stimulation Goals Pain                  PT Short Term Goals - 07/06/16 1139      PT SHORT TERM GOAL #1   Title pt will be independent with HEP   Time 2   Period Weeks   Status New  PT Long Term Goals - 07/06/16 1139      PT LONG TERM GOAL #1   Title increase AROM of the hips to 80 dergrees in standing   Time 8   Period Weeks   Status New     PT LONG TERM GOAL #2   Title go up and down stairs step over step   Time 8   Period Weeks   Status New     PT LONG TERM GOAL #3   Title walk 300 feet without rest   Time 8   Period Weeks   Status New     PT LONG TERM GOAL #4   Title decrease pain in the hips 25%   Time 8   Period Weeks   Status New     PT LONG TERM GOAL #5   Title be able to get in and out of the car without using the hands to lift legs   Time 8   Period Weeks   Status New               Plan - 07/08/16 1241    Clinical Impression Statement The patient stated that she felt very good today and only had pain while getting in and out of her honda accord. She handled all the exercises well, needs max assistance while on airex doing the exercises. The patient has a lot of compensations to perform  exercises.    PT Next Visit Plan Continue with strength and flexibility      Patient will benefit from skilled therapeutic intervention in order to improve the following deficits and impairments:  Decreased range of motion, Decreased activity tolerance, Pain, Increased muscle spasms, Impaired flexibility  Visit Diagnosis: Pain in left hip  Pain in right hip  Difficulty in walking, not elsewhere classified  Muscle weakness (generalized)     Problem List Patient Active Problem List   Diagnosis Date Noted  . Elevated CK 05/03/2016  . Prediabetes 04/28/2016  . Hepatitis C antibody test positive 04/28/2016  . DOE (dyspnea on exertion) 04/23/2016  . Left hip pain 04/23/2016  . Leg heaviness 04/23/2016  . Right arm pain 04/23/2016  . Arthralgia 04/23/2016  . TMJ arthralgia 05/22/2015  . Hyperlipidemia 12/19/2014  . Essential hypertension 12/19/2014  . Hyperglycemia 12/19/2014  . History of colonic polyps 12/19/2014  . Left lumbar radiculopathy 05/27/2009  . Left foot drop 05/27/2009    Alan Mulder SPTA 07/08/2016, 12:52 PM  [address] [address] [place], [place], [address] Phone: [phone]   Fax:  [phone]  Name: Lisa [name] MRN: [id number] Date of Birth: Nov 23, 1955

## 2016-07-13 ENCOUNTER — Ambulatory Visit: Payer: Commercial Managed Care - HMO | Admitting: Physical Therapy

## 2016-07-13 ENCOUNTER — Encounter: Payer: Self-pay | Admitting: Physical Therapy

## 2016-07-13 DIAGNOSIS — M25551 Pain in right hip: Secondary | ICD-10-CM

## 2016-07-13 DIAGNOSIS — M25552 Pain in left hip: Secondary | ICD-10-CM | POA: Diagnosis not present

## 2016-07-13 DIAGNOSIS — R262 Difficulty in walking, not elsewhere classified: Secondary | ICD-10-CM

## 2016-07-13 DIAGNOSIS — M6281 Muscle weakness (generalized): Secondary | ICD-10-CM

## 2016-07-13 NOTE — Therapy (Signed)
Lisa [name], [place], [address] Phone: [phone]   Fax:  [phone]  Physical Therapy Treatment  Patient Details  Name: Lisa [name] MRN: [id number] Date of Birth: November 16, 1955 Referring Provider: Narda Amber  Encounter Date: 07/13/2016      PT End of Session - 07/13/16 1041    Visit Number 3   Date for PT Re-Evaluation 09/05/16   PT Start Time 0934   PT Stop Time 1030   PT Time Calculation (min) 56 min   Activity Tolerance Patient tolerated treatment well   Behavior During Therapy [facility] for tasks assessed/performed      Past Medical History:  Diagnosis Date  . Cataract    removed from both eyes  . GERD (gastroesophageal reflux disease)    no issue while on tums  . Heart murmur   . Heavy menses    irregular; on Provera  . Hyperlipidemia    diet controlled  . Hypertension     Past Surgical History:  Procedure Laterality Date  . CATARACT EXTRACTION Bilateral   . CESAREAN SECTION     X2  . COLONOSCOPY  2008   [facility]  . LASER ABLATION CONDYLOMA CERVICAL / VULVAR    . LASIK Bilateral   . LUMBAR DISC SURGERY  2011   L4-5 ; Dr Shellia Carwin  . POLYPECTOMY    . VITRECTOMY Left 01-2015    There were no vitals filed for this visit.      Subjective Assessment - 07/13/16 0934    Subjective Patient reports that she had a good day on Friday, reports that on Saturday she had tightness in the quads and some pain in the hips.  In and out of the car will increase pain everytime   Currently in Pain? Yes   Pain Score 5    Pain Location Hip   Pain Orientation Left;Right;Anterior   Aggravating Factors  getting in and out of car                         [facility] Adult PT Treatment/Exercise - 07/13/16 0001      High Level Balance   High Level Balance Comments resisted gait all directions     Knee/Hip Exercises: Aerobic   Tread Mill 1.0 MPH x 4 minutes   Nustep L 4 6  min     Knee/Hip Exercises: Standing   Knee Flexion Strengthening;2 sets;10 reps   Knee Flexion Limitations 3#   Hip Flexion Stengthening;Both;2 sets   Hip Flexion Limitations 3#   Hip Abduction Stengthening;Both;2 sets;10 reps   Abduction Limitations on airex   Hip Extension Stengthening;Both;2 sets;10 reps   Extension Limitations on airex     Knee/Hip Exercises: Seated   Long Arc Quad Strengthening;Both;3 sets;10 reps;Weights   Long Arc Quad Weight 3 lbs.   Marching Strengthening;Both;10 reps;3 sets     Moist Heat Therapy   Number Minutes Moist Heat 15 Minutes   Moist Heat Location Hip     Electrical Stimulation   Electrical Stimulation Location anterior hips   Electrical Stimulation Action pre mod   Electrical Stimulation Parameters supine   Electrical Stimulation Goals Pain                  PT Short Term Goals - 07/13/16 1044      PT SHORT TERM GOAL #1   Title pt will be independent with HEP   Status  Partially Met           PT Long Term Goals - 07/06/16 1139      PT LONG TERM GOAL #1   Title increase AROM of the hips to 80 dergrees in standing   Time 8   Period Weeks   Status New     PT LONG TERM GOAL #2   Title go up and down stairs step over step   Time 8   Period Weeks   Status New     PT LONG TERM GOAL #3   Title walk 300 feet without rest   Time 8   Period Weeks   Status New     PT LONG TERM GOAL #4   Title decrease pain in the hips 25%   Time 8   Period Weeks   Status New     PT LONG TERM GOAL #5   Title be able to get in and out of the car without using the hands to lift legs   Time 8   Period Weeks   Status New               Plan - 07/13/16 1042    Clinical Impression Statement Patient with some fatigue with all activities, she has difficulty initiating the motions.  Needed some help to decrease the compensation.  Added walking all directions and tmill today, she was fatigued but seemed to do well without an  increase of pain   PT Next Visit Plan add core, gait and function as tolerated, wants to be able to go to the zoo on Mother's day   Consulted and Agree with Plan of Care Patient      Patient will benefit from skilled therapeutic intervention in order to improve the following deficits and impairments:  Decreased range of motion, Decreased activity tolerance, Pain, Increased muscle spasms, Impaired flexibility  Visit Diagnosis: Pain in left hip  Pain in right hip  Difficulty in walking, not elsewhere classified  Muscle weakness (generalized)     Problem List Patient Active Problem List   Diagnosis Date Noted  . Elevated CK 05/03/2016  . Prediabetes 04/28/2016  . Hepatitis C antibody test positive 04/28/2016  . DOE (dyspnea on exertion) 04/23/2016  . Left hip pain 04/23/2016  . Leg heaviness 04/23/2016  . Right arm pain 04/23/2016  . Arthralgia 04/23/2016  . TMJ arthralgia 05/22/2015  . Hyperlipidemia 12/19/2014  . Essential hypertension 12/19/2014  . Hyperglycemia 12/19/2014  . History of colonic polyps 12/19/2014  . Left lumbar radiculopathy 05/27/2009  . Left foot drop 05/27/2009    Sumner Boast., PT 07/13/2016, 10:44 AM  [address] [place], [place], [address] Phone: [phone]   Fax:  [phone]  Name: Lisa [name] MRN: [id number] Date of Birth: January 03, 1956

## 2016-07-15 ENCOUNTER — Encounter: Payer: Self-pay | Admitting: Neurology

## 2016-07-15 ENCOUNTER — Ambulatory Visit: Payer: Commercial Managed Care - HMO | Admitting: Physical Therapy

## 2016-07-15 ENCOUNTER — Encounter: Payer: Self-pay | Admitting: Physical Therapy

## 2016-07-15 DIAGNOSIS — M25552 Pain in left hip: Secondary | ICD-10-CM

## 2016-07-15 DIAGNOSIS — R262 Difficulty in walking, not elsewhere classified: Secondary | ICD-10-CM

## 2016-07-15 DIAGNOSIS — M6281 Muscle weakness (generalized): Secondary | ICD-10-CM

## 2016-07-15 DIAGNOSIS — M25551 Pain in right hip: Secondary | ICD-10-CM

## 2016-07-15 NOTE — Therapy (Signed)
[place] [place], [place], [address] Phone: [phone]   Fax:  [phone]  Physical Therapy Treatment  Patient Details  Name: Lisa [name] MRN: [id number] Date of Birth: 23-Feb-1956 Referring Provider: Narda Amber  Encounter Date: 07/15/2016      PT End of Session - 07/15/16 1538    Visit Number 4   Date for PT Re-Evaluation 09/05/16   PT Start Time 1450   PT Stop Time 1550   PT Time Calculation (min) 60 min   Activity Tolerance Patient tolerated treatment well   Behavior During Therapy [facility] for tasks assessed/performed      Past Medical History:  Diagnosis Date  . Cataract    removed from both eyes  . GERD (gastroesophageal reflux disease)    no issue while on tums  . Heart murmur   . Heavy menses    irregular; on Provera  . Hyperlipidemia    diet controlled  . Hypertension     Past Surgical History:  Procedure Laterality Date  . CATARACT EXTRACTION Bilateral   . CESAREAN SECTION     X2  . COLONOSCOPY  2008   [facility]  . LASER ABLATION CONDYLOMA CERVICAL / VULVAR    . LASIK Bilateral   . LUMBAR DISC SURGERY  2011   L4-5 ; Dr Shellia Carwin  . POLYPECTOMY    . VITRECTOMY Left 01-2015    There were no vitals filed for this visit.      Subjective Assessment - 07/15/16 1531    Subjective Patient reports that she did some shopping and has been up on her feet, she is tired and reports that her hips feel very weak, reporting that if she let go of the cart her hips would just give.   Currently in Pain? Yes   Pain Score 5    Pain Location Hip   Pain Orientation Right;Left;Anterior                         [facility] Adult PT Treatment/Exercise - 07/15/16 0001      Knee/Hip Exercises: Aerobic   Nustep L 5 6 min     Knee/Hip Exercises: Machines for Strengthening   Hip Cybex 30# hip press down while standing   Other Machine resisted walking with 20# all  directions with HHA, this was difficult for her but she was able to hold good posture during htis     Knee/Hip Exercises: Standing   Other Standing Knee Exercises hip and posture corrections with tband around waist pulling in various directions and her having to correct repetitively     Moist Heat Therapy   Number Minutes Moist Heat 15 Minutes   Moist Heat Location Hip     Electrical Stimulation   Electrical Stimulation Location anterior hips   Electrical Stimulation Action pre mod   Electrical Stimulation Parameters supine   Electrical Stimulation Goals Pain                  PT Short Term Goals - 07/15/16 1543      PT SHORT TERM GOAL #1   Title pt will be independent with HEP   Status Achieved           PT Long Term Goals - 07/06/16 1139      PT LONG TERM GOAL #1   Title increase AROM of the hips to 80 dergrees in standing   Time 8  Period Weeks   Status New     PT LONG TERM GOAL #2   Title go up and down stairs step over step   Time 8   Period Weeks   Status New     PT LONG TERM GOAL #3   Title walk 300 feet without rest   Time 8   Period Weeks   Status New     PT LONG TERM GOAL #4   Title decrease pain in the hips 25%   Time 8   Period Weeks   Status New     PT LONG TERM GOAL #5   Title be able to get in and out of the car without using the hands to lift legs   Time 8   Period Weeks   Status New               Plan - 07/15/16 1539    Clinical Impression Statement We worked on some different things today that made her use a lot of core mms and work her mms in a different way, some neuro control with the eccentric walking.   PT Next Visit Plan See if what we did today caused any adverse reactions, continue with core and walking   Consulted and Agree with Plan of Care Patient      Patient will benefit from skilled therapeutic intervention in order to improve the following deficits and impairments:  Decreased range of motion,  Decreased activity tolerance, Pain, Increased muscle spasms, Impaired flexibility  Visit Diagnosis: Pain in left hip  Pain in right hip  Difficulty in walking, not elsewhere classified  Muscle weakness (generalized)     Problem List Patient Active Problem List   Diagnosis Date Noted  . Elevated CK 05/03/2016  . Prediabetes 04/28/2016  . Hepatitis C antibody test positive 04/28/2016  . DOE (dyspnea on exertion) 04/23/2016  . Left hip pain 04/23/2016  . Leg heaviness 04/23/2016  . Right arm pain 04/23/2016  . Arthralgia 04/23/2016  . TMJ arthralgia 05/22/2015  . Hyperlipidemia 12/19/2014  . Essential hypertension 12/19/2014  . Hyperglycemia 12/19/2014  . History of colonic polyps 12/19/2014  . Left lumbar radiculopathy 05/27/2009  . Left foot drop 05/27/2009    Sumner Boast., PT 07/15/2016, 3:44 PM  [place] [address] [place], [place], [address] Phone: [phone]   Fax:  [phone]  Name: DEONDREA AGUADO MRN: [id number] Date of Birth: 06-10-1955

## 2016-07-19 ENCOUNTER — Ambulatory Visit: Payer: Commercial Managed Care - HMO | Admitting: Physical Therapy

## 2016-07-19 ENCOUNTER — Encounter: Payer: Self-pay | Admitting: Physical Therapy

## 2016-07-19 DIAGNOSIS — M6281 Muscle weakness (generalized): Secondary | ICD-10-CM

## 2016-07-19 DIAGNOSIS — M25551 Pain in right hip: Secondary | ICD-10-CM

## 2016-07-19 DIAGNOSIS — R262 Difficulty in walking, not elsewhere classified: Secondary | ICD-10-CM

## 2016-07-19 DIAGNOSIS — M25552 Pain in left hip: Secondary | ICD-10-CM

## 2016-07-19 NOTE — Therapy (Signed)
[place], [place], [address] Phone: [phone]   Fax:  [phone]  Physical Therapy Treatment  Patient Details  Name: Lisa [name] MRN: [id number] Date of Birth: 02-Aug-1955 Referring Provider: Narda Amber  Encounter Date: 07/19/2016      PT End of Session - 07/19/16 1019    Visit Number 5   Date for PT Re-Evaluation 09/05/16   PT Start Time 0930   PT Stop Time 1025   PT Time Calculation (min) 55 min   Activity Tolerance Patient tolerated treatment well   Behavior During Therapy [facility] for tasks assessed/performed      Past Medical History:  Diagnosis Date  . Cataract    removed from both eyes  . GERD (gastroesophageal reflux disease)    no issue while on tums  . Heart murmur   . Heavy menses    irregular; on Provera  . Hyperlipidemia    diet controlled  . Hypertension     Past Surgical History:  Procedure Laterality Date  . CATARACT EXTRACTION Bilateral   . CESAREAN SECTION     X2  . COLONOSCOPY  2008   [facility]  . LASER ABLATION CONDYLOMA CERVICAL / VULVAR    . LASIK Bilateral   . LUMBAR DISC SURGERY  2011   L4-5 ; Dr Shellia Carwin  . POLYPECTOMY    . VITRECTOMY Left 01-2015    There were no vitals filed for this visit.      Subjective Assessment - 07/19/16 0933    Subjective Patient reports that she feels tight and is in some pain. She rates her Left hip a 6/10 and her right hip a 5/10. She states that she was cleaning and sorting closet over the weekend involving moving and lifting things and she was in pain last night.    Currently in Pain? Yes   Pain Score 6    Pain Location Hip   Pain Orientation Left;Anterior   Pain Descriptors / Indicators Tightness;Patsi Sears Adult PT Treatment/Exercise - 07/19/16 0001      Knee/Hip Exercises: Aerobic   Tread Mill 1.0 MPH x 4 minutes   Nustep L 6 6 min     Knee/Hip  Exercises: Machines for Strengthening   Other Machine resisted walking forward x 3      Knee/Hip Exercises: Standing   Lateral Step Up Left;3 sets;10 reps;Step Height: 4"   Lateral Step Up Limitations Seated on sit fit, 2#    Forward Step Up Both;3 sets;10 reps;Step Height: 4"   Forward Step Up Limitations Seated on sit fit, 2#      Knee/Hip Exercises: Seated   Ball Squeeze 3x10   Hamstring Curl Strengthening;Both;3 sets;10 reps   Hamstring Limitations red tband     Knee/Hip Exercises: Supine   Bridges Strengthening;Both;2 sets;10 reps   Bridges Limitations on red ball   Other Supine Knee/Hip Exercises red ball abs set 3 x 10, oblique 2 x 10      Moist Heat Therapy   Number Minutes Moist Heat 15 Minutes   Moist Heat Location Hip     Electrical Stimulation   Electrical Stimulation Location anterior hips   Electrical Stimulation Action premod   Electrical Stimulation Parameters supine   Electrical Stimulation Goals Pain  PT Short Term Goals - 07/15/16 1543      PT SHORT TERM GOAL #1   Title pt will be independent with HEP   Status Achieved           PT Long Term Goals - 07/06/16 1139      PT LONG TERM GOAL #1   Title increase AROM of the hips to 80 dergrees in standing   Time 8   Period Weeks   Status New     PT LONG TERM GOAL #2   Title go up and down stairs step over step   Time 8   Period Weeks   Status New     PT LONG TERM GOAL #3   Title walk 300 feet without rest   Time 8   Period Weeks   Status New     PT LONG TERM GOAL #4   Title decrease pain in the hips 25%   Time 8   Period Weeks   Status New     PT LONG TERM GOAL #5   Title be able to get in and out of the car without using the hands to lift legs   Time 8   Period Weeks   Status New               Plan - 07/19/16 1019    Clinical Impression Statement The patient stated that her left hip was hurting more today than her right. During the seated  lateral step ups she was unable to do the right leg due to a pinching pain, she stated that is the pain she has when trying to get in her car. The patient was able to complete forward resisted walking without any HHA., however she did have a difficult time controling the backwards movement.    PT Next Visit Plan Contin with strengthening and see how she felt after today with the newer exercises.       Patient will benefit from skilled therapeutic intervention in order to improve the following deficits and impairments:  Decreased range of motion, Decreased activity tolerance, Pain, Increased muscle spasms, Impaired flexibility  Visit Diagnosis: Pain in left hip  Pain in right hip  Difficulty in walking, not elsewhere classified  Muscle weakness (generalized)     Problem List Patient Active Problem List   Diagnosis Date Noted  . Elevated CK 05/03/2016  . Prediabetes 04/28/2016  . Hepatitis C antibody test positive 04/28/2016  . DOE (dyspnea on exertion) 04/23/2016  . Left hip pain 04/23/2016  . Leg heaviness 04/23/2016  . Right arm pain 04/23/2016  . Arthralgia 04/23/2016  . TMJ arthralgia 05/22/2015  . Hyperlipidemia 12/19/2014  . Essential hypertension 12/19/2014  . Hyperglycemia 12/19/2014  . History of colonic polyps 12/19/2014  . Left lumbar radiculopathy 05/27/2009  . Left foot drop 05/27/2009    Alan Mulder SPTA 07/19/2016, 10:29 AM  [place] [address] [address] [place], [place], [address] Phone: [phone]   Fax:  [phone]  Name: Lisa [name] MRN: [id number] Date of Birth: 02-Aug-1955

## 2016-07-20 ENCOUNTER — Encounter: Payer: Self-pay | Admitting: Neurology

## 2016-07-22 ENCOUNTER — Ambulatory Visit: Payer: Commercial Managed Care - HMO | Attending: Internal Medicine | Admitting: Physical Therapy

## 2016-07-22 ENCOUNTER — Encounter: Payer: Self-pay | Admitting: Physical Therapy

## 2016-07-22 DIAGNOSIS — M25551 Pain in right hip: Secondary | ICD-10-CM | POA: Diagnosis not present

## 2016-07-22 DIAGNOSIS — R262 Difficulty in walking, not elsewhere classified: Secondary | ICD-10-CM | POA: Insufficient documentation

## 2016-07-22 DIAGNOSIS — M6281 Muscle weakness (generalized): Secondary | ICD-10-CM | POA: Insufficient documentation

## 2016-07-22 DIAGNOSIS — M25552 Pain in left hip: Secondary | ICD-10-CM | POA: Insufficient documentation

## 2016-07-22 NOTE — Therapy (Signed)
[place], [place], [address] Phone: [phone]   Fax:  [phone]  Physical Therapy Treatment  Patient Details  Name: Lisa [name] MRN: [id number] Date of Birth: 01-26-1956 Referring Provider: Narda Amber  Encounter Date: 07/22/2016      PT End of Session - 07/22/16 1608    Visit Number 6   Date for PT Re-Evaluation 09/05/16   PT Start Time 1529   PT Stop Time 1625   PT Time Calculation (min) 56 min   Activity Tolerance Patient tolerated treatment well;Patient limited by pain   Behavior During Therapy [facility] for tasks assessed/performed      Past Medical History:  Diagnosis Date  . Cataract    removed from both eyes  . GERD (gastroesophageal reflux disease)    no issue while on tums  . Heart murmur   . Heavy menses    irregular; on Provera  . Hyperlipidemia    diet controlled  . Hypertension     Past Surgical History:  Procedure Laterality Date  . CATARACT EXTRACTION Bilateral   . CESAREAN SECTION     X2  . COLONOSCOPY  2008   [facility]  . LASER ABLATION CONDYLOMA CERVICAL / VULVAR    . LASIK Bilateral   . LUMBAR DISC SURGERY  2011   L4-5 ; Dr Shellia Carwin  . POLYPECTOMY    . VITRECTOMY Left 01-2015    There were no vitals filed for this visit.      Subjective Assessment - 07/22/16 1534    Subjective Patient reports that she has done a lot today,playing with her grandkids in the back yard and walking around running errands.    Currently in Pain? Yes   Pain Score 5    Pain Location Hip   Pain Orientation Right   Pain Descriptors / Indicators [facility] Sears Adult PT Treatment/Exercise - 07/22/16 0001      Knee/Hip Exercises: Standing   Lateral Step Up Left;3 sets;10 reps;Step Height: 6"   Forward Step Up Both;3 sets;10 reps;Step Height: 6"     Knee/Hip Exercises: Supine   Short Arc Quad Sets Both;1 set;10 reps   Short  Arc Quad Sets Limitations Could only before 1 set due to pain   Heel Slides Both;3 sets;10 reps   Heel Slides Limitations sliding board   Other Supine Knee/Hip Exercises Sliding board abd 3 x 10 RLE   Other Supine Knee/Hip Exercises glute set 1 x 10 6 sec hold     Modalities   Modalities Cryotherapy;Electrical Stimulation     Cryotherapy   Number Minutes Cryotherapy 15 Minutes   Cryotherapy Location Hip   Type of Cryotherapy Ice pack     Electrical Stimulation   Electrical Stimulation Location anterior hips   Electrical Stimulation Action premod   Electrical Stimulation Parameters supine   Electrical Stimulation Goals Pain                  PT Short Term Goals - 07/15/16 1543      PT SHORT TERM GOAL #1   Title pt will be independent with HEP   Status Achieved           PT Long Term Goals - 07/06/16 1139      PT LONG TERM GOAL #1   Title increase  AROM of the hips to 80 dergrees in standing   Time 8   Period Weeks   Status New     PT LONG TERM GOAL #2   Title go up and down stairs step over step   Time 8   Period Weeks   Status New     PT LONG TERM GOAL #3   Title walk 300 feet without rest   Time 8   Period Weeks   Status New     PT LONG TERM GOAL #4   Title decrease pain in the hips 25%   Time 8   Period Weeks   Status New     PT LONG TERM GOAL #5   Title be able to get in and out of the car without using the hands to lift legs   Time 8   Period Weeks   Status New               Plan - 07/22/16 1608    Clinical Impression Statement The patient stated that she has done a lot today including playing with her grandkids out in the yard and running errands. She was still unable to do the right leg lateral step up due to pinching pain. We switched to supine sliding board abduction which she was able to handle with no pain. After 1 set of the short arc quads she started to get that pinching pain in her left leg.    PT Next Visit Plan Cont  with strengthening       Patient will benefit from skilled therapeutic intervention in order to improve the following deficits and impairments:  Decreased range of motion, Decreased activity tolerance, Pain, Increased muscle spasms, Impaired flexibility  Visit Diagnosis: Pain in left hip  Pain in right hip  Difficulty in walking, not elsewhere classified  Muscle weakness (generalized)     Problem List Patient Active Problem List   Diagnosis Date Noted  . Elevated CK 05/03/2016  . Prediabetes 04/28/2016  . Hepatitis C antibody test positive 04/28/2016  . DOE (dyspnea on exertion) 04/23/2016  . Left hip pain 04/23/2016  . Leg heaviness 04/23/2016  . Right arm pain 04/23/2016  . Arthralgia 04/23/2016  . TMJ arthralgia 05/22/2015  . Hyperlipidemia 12/19/2014  . Essential hypertension 12/19/2014  . Hyperglycemia 12/19/2014  . History of colonic polyps 12/19/2014  . Left lumbar radiculopathy 05/27/2009  . Left foot drop 05/27/2009    Sumner Boast., PT 07/22/2016, 4:22 PM  [place] [address] [place], [place], [address] Phone: [phone]   Fax:  [phone]  Name: Lisa [name] MRN: [id number] Date of Birth: [birth year]

## 2016-07-26 ENCOUNTER — Encounter: Payer: Self-pay | Admitting: *Deleted

## 2016-07-27 ENCOUNTER — Encounter: Payer: Self-pay | Admitting: Physical Therapy

## 2016-07-27 ENCOUNTER — Ambulatory Visit: Payer: Commercial Managed Care - HMO | Admitting: Physical Therapy

## 2016-07-27 DIAGNOSIS — M6281 Muscle weakness (generalized): Secondary | ICD-10-CM

## 2016-07-27 DIAGNOSIS — M25552 Pain in left hip: Secondary | ICD-10-CM

## 2016-07-27 DIAGNOSIS — M25551 Pain in right hip: Secondary | ICD-10-CM

## 2016-07-27 DIAGNOSIS — R262 Difficulty in walking, not elsewhere classified: Secondary | ICD-10-CM

## 2016-07-27 NOTE — Therapy (Signed)
[place], [place], [address] Phone: [phone]   Fax:  [phone]  Physical Therapy Treatment  Patient Details  Name: Lisa [name] MRN: [id number] Date of Birth: 1955-06-07 Referring Provider: Narda Amber  Encounter Date: 07/27/2016      PT End of Session - 07/27/16 1445    Visit Number 7   Date for PT Re-Evaluation 09/05/16   PT Start Time 1310   PT Stop Time 1406   PT Time Calculation (min) 56 min   Activity Tolerance Patient tolerated treatment well;Patient limited by fatigue   Behavior During Therapy [facility] for tasks assessed/performed      Past Medical History:  Diagnosis Date  . Cataract    removed from both eyes  . GERD (gastroesophageal reflux disease)    no issue while on tums  . Heart murmur   . Heavy menses    irregular; on Provera  . Hyperlipidemia    diet controlled  . Hypertension     Past Surgical History:  Procedure Laterality Date  . CATARACT EXTRACTION Bilateral   . CESAREAN SECTION     X2  . COLONOSCOPY  2008   [facility]  . LASER ABLATION CONDYLOMA CERVICAL / VULVAR    . LASIK Bilateral   . LUMBAR DISC SURGERY  2011   L4-5 ; Dr Shellia Carwin  . POLYPECTOMY    . VITRECTOMY Left 01-2015    There were no vitals filed for this visit.      Subjective Assessment - 07/27/16 1317    Subjective Reports that she overdid it a few times since she was here last, had to watch grandkids again and then had her husbands birthday,  reports pain in the hips is the limiting factor with her activites   Currently in Pain? Yes   Pain Score 5    Pain Location Hip   Pain Orientation Right;Anterior   Aggravating Factors  walking increases the pain                         [facility] Adult PT Treatment/Exercise - 07/27/16 0001      Ambulation/Gait   Gait Comments gait no device, using the right hand on the anterior hip to brace herself, had to have 3 rests  in about 450' due to fatigue and then as she went the pain in the hips became thje limiting factor at the end     Knee/Hip Exercises: Stretches   Gastroc Stretch 30 seconds;3 reps     Knee/Hip Exercises: Aerobic   Recumbent Bike x 4 minutes seat position 5   Nustep L 4 6 min     Knee/Hip Exercises: Machines for Strengthening   Cybex Knee Flexion 20# 2x10   Other Machine seated row 15# and lats 15# x10 each     Knee/Hip Exercises: Supine   Other Supine Knee/Hip Exercises Sliding board abd 3 x 10 RLE   Other Supine Knee/Hip Exercises glute set 1 x 10 6 sec hold     Electrical Stimulation   Electrical Stimulation Location anterior hips   Electrical Stimulation Action premod   Electrical Stimulation Parameters supine   Electrical Stimulation Goals Pain                  PT Short Term Goals - 07/15/16 1543      PT SHORT TERM GOAL #1   Title pt will be independent with HEP  Status Achieved           PT Long Term Goals - 07/06/16 1139      PT LONG TERM GOAL #1   Title increase AROM of the hips to 80 dergrees in standing   Time 8   Period Weeks   Status New     PT LONG TERM GOAL #2   Title go up and down stairs step over step   Time 8   Period Weeks   Status New     PT LONG TERM GOAL #3   Title walk 300 feet without rest   Time 8   Period Weeks   Status New     PT LONG TERM GOAL #4   Title decrease pain in the hips 25%   Time 8   Period Weeks   Status New     PT LONG TERM GOAL #5   Title be able to get in and out of the car without using the hands to lift legs   Time 8   Period Weeks   Status New               Plan - 07/27/16 1446    Clinical Impression Statement Patient reports that she has not heard back from the MD for the genetic testing and may not until the end of June.  She was limited mostly in her walking today to about 150' due to fatigue, we would stop for about 30 seconds and then she could go again.     PT Next Visit Plan  Patient is to have an injection in her hip this Thursday so we will cancel our treatment Friday.  I asked her to go and take pictures at a gym as we may look at holding our treatment until she gets a diagnosis   Consulted and Agree with Plan of Care Patient      Patient will benefit from skilled therapeutic intervention in order to improve the following deficits and impairments:  Decreased range of motion, Decreased activity tolerance, Pain, Increased muscle spasms, Impaired flexibility  Visit Diagnosis: Pain in left hip  Pain in right hip  Difficulty in walking, not elsewhere classified  Muscle weakness (generalized)     Problem List Patient Active Problem List   Diagnosis Date Noted  . Elevated CK 05/03/2016  . Prediabetes 04/28/2016  . Hepatitis C antibody test positive 04/28/2016  . DOE (dyspnea on exertion) 04/23/2016  . Left hip pain 04/23/2016  . Leg heaviness 04/23/2016  . Right arm pain 04/23/2016  . Arthralgia 04/23/2016  . TMJ arthralgia 05/22/2015  . Hyperlipidemia 12/19/2014  . Essential hypertension 12/19/2014  . Hyperglycemia 12/19/2014  . History of colonic polyps 12/19/2014  . Left lumbar radiculopathy 05/27/2009  . Left foot drop 05/27/2009    Sumner Boast., PT 07/27/2016, 2:51 PM  [place] [address] [place], [place], [address] Phone: [phone]   Fax:  [phone]  Name: Lisa [name] MRN: [id number] Date of Birth: 07/11/1955

## 2016-07-29 DIAGNOSIS — M25552 Pain in left hip: Secondary | ICD-10-CM | POA: Diagnosis not present

## 2016-07-30 ENCOUNTER — Ambulatory Visit: Payer: Commercial Managed Care - HMO | Admitting: Physical Therapy

## 2016-08-04 ENCOUNTER — Ambulatory Visit: Payer: Commercial Managed Care - HMO | Admitting: Physical Therapy

## 2016-08-04 ENCOUNTER — Encounter: Payer: Self-pay | Admitting: Physical Therapy

## 2016-08-04 DIAGNOSIS — M25552 Pain in left hip: Secondary | ICD-10-CM | POA: Diagnosis not present

## 2016-08-04 DIAGNOSIS — R262 Difficulty in walking, not elsewhere classified: Secondary | ICD-10-CM

## 2016-08-04 DIAGNOSIS — M25551 Pain in right hip: Secondary | ICD-10-CM

## 2016-08-04 DIAGNOSIS — M6281 Muscle weakness (generalized): Secondary | ICD-10-CM

## 2016-08-04 NOTE — Therapy (Signed)
[place], [place], [address] Phone: [phone]   Fax:  [phone]  Physical Therapy Treatment  Patient Details  Name: Lisa [name] MRN: [id number] Date of Birth: [birth year] Referring Provider: Narda Amber  Encounter Date: 08/04/2016      PT End of Session - 08/04/16 1711    Visit Number 8   Date for PT Re-Evaluation 09/05/16   PT Start Time 3846   PT Stop Time 1533   PT Time Calculation (min) 50 min   Activity Tolerance Patient tolerated treatment well   Behavior During Therapy [facility] for tasks assessed/performed      Past Medical History:  Diagnosis Date  . Cataract    removed from both eyes  . GERD (gastroesophageal reflux disease)    no issue while on tums  . Heart murmur   . Heavy menses    irregular; on Provera  . Hyperlipidemia    diet controlled  . Hypertension     Past Surgical History:  Procedure Laterality Date  . CATARACT EXTRACTION Bilateral   . CESAREAN SECTION     X2  . COLONOSCOPY  2008   [facility]  . LASER ABLATION CONDYLOMA CERVICAL / VULVAR    . LASIK Bilateral   . LUMBAR DISC SURGERY  2011   L4-5 ; Dr Shellia Carwin  . POLYPECTOMY    . VITRECTOMY Left 01-2015    There were no vitals filed for this visit.      Subjective Assessment - 08/04/16 1446    Subjective Had a cortisone injection in the left hip last week, reports feeling a little better with less pain   Currently in Pain? No/denies                         [facility] Adult PT Treatment/Exercise - 08/04/16 0001      Self-Care   Self-Care Other Self-Care Comments   Posture Patient went to the gym that she is going to join and took pix of ythe equipment, I went over with her all the adjustments that could be made, weights, reps, sets and form, we attempted everything on our machines and she was able to do with cues, she verbalized understanding     Knee/Hip Exercises: Aerobic    Nustep L 4 6 min     Knee/Hip Exercises: Seated   Ball Squeeze 3x10   Clamshell with TheraBand Red   Marching Strengthening;Both;10 reps;3 sets   Marching Limitations some marching with abduction and back to adduction to simulate the car transfer                  PT Short Term Goals - 07/15/16 1543      PT SHORT TERM GOAL #1   Title pt will be independent with HEP   Status Achieved           PT Long Term Goals - 08/04/16 1713      PT LONG TERM GOAL #1   Title increase AROM of the hips to 80 dergrees in standing   Status Partially Met     PT LONG TERM GOAL #2   Title go up and down stairs step over step   Status Partially Met     PT LONG TERM GOAL #3   Title walk 300 feet without rest   Status Partially Met     PT LONG TERM GOAL #4   Title decrease  pain in the hips 25%   Status Achieved     PT LONG TERM GOAL #5   Title be able to get in and out of the car without using the hands to lift legs   Status Partially Met               Plan - 08/04/16 1712    Clinical Impression Statement Patient did well with understanding transition to the gym.  She will try this on her own   PT Next Visit Plan we will d/c and have her try on her own at the gym, she will have results of genetic testing at the end of June, if we can help with anything at that time we may become involved in her care at that time.   Consulted and Agree with Plan of Care Patient      Patient will benefit from skilled therapeutic intervention in order to improve the following deficits and impairments:     Visit Diagnosis: Pain in left hip  Pain in right hip  Difficulty in walking, not elsewhere classified  Muscle weakness (generalized)     Problem List Patient Active Problem List   Diagnosis Date Noted  . Elevated CK 05/03/2016  . Prediabetes 04/28/2016  . Hepatitis C antibody test positive 04/28/2016  . DOE (dyspnea on exertion) 04/23/2016  . Left hip pain 04/23/2016   . Leg heaviness 04/23/2016  . Right arm pain 04/23/2016  . Arthralgia 04/23/2016  . TMJ arthralgia 05/22/2015  . Hyperlipidemia 12/19/2014  . Essential hypertension 12/19/2014  . Hyperglycemia 12/19/2014  . History of colonic polyps 12/19/2014  . Left lumbar radiculopathy 05/27/2009  . Left foot drop 05/27/2009    Sumner Boast., PT 08/04/2016, 5:14 PM  [place] [address] [place], [place], [address] Phone: [phone]   Fax:  [phone]  Name: Lisa [name] MRN: [id number] Date of Birth: June 30, 1955

## 2016-08-18 DIAGNOSIS — G710 Muscular dystrophy: Secondary | ICD-10-CM | POA: Diagnosis not present

## 2016-08-18 DIAGNOSIS — M25552 Pain in left hip: Secondary | ICD-10-CM | POA: Diagnosis not present

## 2016-08-18 DIAGNOSIS — G5601 Carpal tunnel syndrome, right upper limb: Secondary | ICD-10-CM | POA: Diagnosis not present

## 2016-08-24 DIAGNOSIS — L400 Psoriasis vulgaris: Secondary | ICD-10-CM | POA: Diagnosis not present

## 2016-08-24 DIAGNOSIS — L814 Other melanin hyperpigmentation: Secondary | ICD-10-CM | POA: Diagnosis not present

## 2016-08-24 DIAGNOSIS — D2262 Melanocytic nevi of left upper limb, including shoulder: Secondary | ICD-10-CM | POA: Diagnosis not present

## 2016-08-24 DIAGNOSIS — L718 Other rosacea: Secondary | ICD-10-CM | POA: Diagnosis not present

## 2016-08-24 DIAGNOSIS — L821 Other seborrheic keratosis: Secondary | ICD-10-CM | POA: Diagnosis not present

## 2016-08-24 DIAGNOSIS — D485 Neoplasm of uncertain behavior of skin: Secondary | ICD-10-CM | POA: Diagnosis not present

## 2016-09-15 ENCOUNTER — Encounter: Payer: Self-pay | Admitting: Neurology

## 2016-10-12 ENCOUNTER — Encounter: Payer: Self-pay | Admitting: Neurology

## 2016-10-15 ENCOUNTER — Telehealth: Payer: Self-pay | Admitting: Neurology

## 2016-10-15 MED ORDER — NAPROXEN 500 MG PO TABS: 500.0000 mg | ORAL_TABLET | Freq: Every day | ORAL | 2 refills | Status: DC | PRN

## 2016-10-15 NOTE — Telephone Encounter (Signed)
Caller: Helene Kelp  Urgent? No  Reason for the call: She said she is needing Naproxen 500 MG called into [facility] on [address]. Thanks

## 2016-10-15 NOTE — Telephone Encounter (Signed)
I looked in your last note and I don't see Naproxen mentioned.  Do you want me to send Rx?

## 2016-10-15 NOTE — Telephone Encounter (Signed)
Prescription has been sent.  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  I discussed these findings along with patient's phenotype with genetic counselor at [facility] who suggested that we can test symptomatic family members for the same gene mutation to see if this is indeed clinically relevant as it can be present in myofibrillary myopathy.  Unfortunately, this panel did not include testing for FSHD and the lab will check to see if there is sample available to send additional testing on this.  This was explained to patient and with her history, I recommend that first step is to check gene mutation for FSHD.  If this returns negative, we can offer genetic testing for [facility] to symptomatic family members, if present, or consider NCS/EMG and muscle biopsy.  She did not want to pursue EMG due to intolerance of testing in the past.  All questions were answered.    Hadley Soileau K. Posey Pronto, DO

## 2016-10-27 ENCOUNTER — Other Ambulatory Visit: Payer: Self-pay | Admitting: Internal Medicine

## 2016-10-28 ENCOUNTER — Encounter: Payer: Self-pay | Admitting: Neurology

## 2016-10-28 DIAGNOSIS — M25552 Pain in left hip: Secondary | ICD-10-CM | POA: Diagnosis not present

## 2016-10-29 ENCOUNTER — Encounter: Payer: Self-pay | Admitting: Neurology

## 2016-10-29 ENCOUNTER — Telehealth: Payer: Self-pay | Admitting: Neurology

## 2016-10-29 NOTE — Telephone Encounter (Signed)
Patient called needing to get another code from you for her Genetic testing. She said the code she was given was letters but they said it is #'s only.  Please call. Thanks

## 2016-10-29 NOTE — Telephone Encounter (Signed)
Will call with new code.

## 2016-11-11 DIAGNOSIS — G710 Muscular dystrophy: Secondary | ICD-10-CM | POA: Diagnosis not present

## 2016-11-11 DIAGNOSIS — M21372 Foot drop, left foot: Secondary | ICD-10-CM | POA: Diagnosis not present

## 2016-11-24 ENCOUNTER — Telehealth: Payer: Self-pay | Admitting: Neurology

## 2016-11-24 NOTE — Telephone Encounter (Signed)
Called patient and informed her that her [facility] lab for FSHD returned positive.  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  She will return for f/u on 10/2 at [place]. Posey Pronto, DO

## 2016-11-25 DIAGNOSIS — I10 Essential (primary) hypertension: Secondary | ICD-10-CM | POA: Diagnosis not present

## 2016-11-25 DIAGNOSIS — M25552 Pain in left hip: Secondary | ICD-10-CM | POA: Diagnosis not present

## 2016-12-17 ENCOUNTER — Encounter: Payer: Self-pay | Admitting: Neurology

## 2016-12-21 ENCOUNTER — Ambulatory Visit (INDEPENDENT_AMBULATORY_CARE_PROVIDER_SITE_OTHER): Payer: 59 | Admitting: Neurology

## 2016-12-21 ENCOUNTER — Other Ambulatory Visit: Payer: Self-pay | Admitting: Internal Medicine

## 2016-12-21 ENCOUNTER — Encounter: Payer: Self-pay | Admitting: Neurology

## 2016-12-21 VITALS — BP 130/80 | HR 104 | Ht 67.0 in | Wt 221.0 lb

## 2016-12-21 DIAGNOSIS — G7102 Facioscapulohumeral muscular dystrophy: Secondary | ICD-10-CM

## 2016-12-21 MED ORDER — MELOXICAM 7.5 MG PO TABS: 7.5000 mg | ORAL_TABLET | Freq: Every day | ORAL | 5 refills | Status: DC | PRN

## 2016-12-21 NOTE — Progress Notes (Signed)
Follow-up Visit   Date: October 2nd, 2018    Lisa [name] MRN: [id number] DOB: [birth year]   Interim History: Lisa [name] is a 61 y.o. right-handed Caucasian female with hyperlipidemia, hypertension, GERD, s/p lumbar lamintactomy L5-S1 (2006) with residual left foot drop returning to the clinic for follow-up of FSHD.  The patient was accompanied to the clinic by self.  History of present illness: She had lumbar decompression for left radicular pain in 2006.  Following surgery, her pain subsided but several months later, she developed left foot drop.  She saw Dr. Krista Blue at [facility] who performed NCS/EMG whose testing suggested her foot drop was stemming from her back.  She tried using AFO initially, but did not find it comfortable, so stopped using it.  She has been walking unassisted for several years.  Since 2017, she had increased heaviness of the legs and left throbbing non-radiating pain over the left hip.  She has difficulty climbing stairs, getting up from low chairs, and squatting.  Over the past several months, she also complains of right hip throbbing pain.  For many years, she has been unable to raise her left arm with arm extension and abduction.  She has noticed asymmetry of the shoulder on the left where it tends to be lower.  She recalls always noticing that her hand bag would slip off her shoulder when she would place it on the left shoulder.  She denies difficulty swallowing or talking, cramps, muscle twitches, droopy eyelids, or muscle pain.  She also complains of tingling and burning pain over the right index finger, which is worse at night time. She feels that certain neck position trigger tingling of the hand.  She went to physical therapy for this but did not find much improvement.  She has been using a wrist splint at night time.   Her maternal uncle had wasting of the arms and legs and was told he has "muscle disease."  Her mother's cousins were diagnosed with  muscular dystrophy.  In her 12s, she recalls being at a family reunion where students from [place] where examining her family members for signs of fascioscapulohumeral dystrophy.   She has two grown children who do not have weakness.  Her daughter has started to complain of her handbag slipping off her arm, which concerns patient.   She had MRI lumbar spine which shows L4-5 facet arthropathy and L5-S1 chronic foraminal stenosis as well as impressive fatty infiltration of the back muscles, suggestive of adult onset muscular dystrophy for which patient was referred to see me.  UPDATE 12/21/2016:  She is here for follow-up visit.  Her genetic testing through [facility] returned positive for FSHD and she was notified of this via telephone and is here for discuss further management.  She is aware that there is no curative treatment for this disease and management is supportive.  She has noticed very slow deterioration of muscle strength in her arms and legs since her last visit, such that she gets fatigues very quickly when walking and has become more dependent on shopping carts.  She has also started using a cane and walker.  She suffered one fall last week and was unable to stand up without the help of her husband.  She is very concerned about her hip pain.  She has stabbing and sharp pain in the left hip and has received steroid injection of the left anterior hip and and will be getting another one to her low back  region. She denies any difficulty with swallowing, talking, or shortness of breath.   Medications:  Current Outpatient Prescriptions on File Prior to Visit  Medication Sig Dispense Refill  . aspirin (ASPIRIN EC) 81 MG EC tablet Take 81 mg by mouth daily. Swallow whole.    Marland Kitchen atorvastatin (LIPITOR) 20 MG tablet Take 1 tablet (20 mg total) by mouth daily. 90 tablet 3  . calcium carbonate (TUMS - DOSED IN MG ELEMENTAL CALCIUM) 500 MG chewable tablet Chew 1 tablet by mouth as needed for indigestion or  heartburn.    . hydrochlorothiazide (HYDRODIURIL) 25 MG tablet TAKE 1 TABLET(25 MG) BY MOUTH DAILY 90 tablet 3  . Magnesium 250 MG TABS Take by mouth daily.    . Multiple Vitamin (MULTI VITAMIN DAILY PO) Take by mouth.    . Omega-3 Fatty Acids (FISH OIL) 1200 MG CPDR Take 1,200 mg by mouth daily.    . SOOLANTRA 1 % CREA APP AA ON THE FACE QD  3  . Vitamin D, Cholecalciferol, 1000 UNITS TABS Take 1,000 Units by mouth daily.    . [DISCONTINUED] losartan-hydrochlorothiazide (HYZAAR) 50-12.5 MG tablet Take 1 tablet by mouth daily. 90 tablet 3   No current facility-administered medications on file prior to visit.     Allergies:  Allergies  Allergen Reactions  . Lisinopril Cough    Review of Systems:  CONSTITUTIONAL: No fevers, chills, night sweats, or weight loss.  EYES: No visual changes or eye pain ENT: No hearing changes.  No history of nose bleeds.   RESPIRATORY: No cough, wheezing and shortness of breath.   CARDIOVASCULAR: Negative for chest pain, and palpitations.   GI: Negative for abdominal discomfort, blood in stools or black stools.  No recent change in bowel habits.   GU:  No history of incontinence.   MUSCLOSKELETAL: No history of joint pain or swelling.  No myalgias.   SKIN: Negative for lesions, rash, and itching.   ENDOCRINE: Negative for cold or heat intolerance, polydipsia or goiter.   PSYCH:  No depression or anxiety symptoms.   NEURO: As Above.   Vital Signs:  BP 130/80   Pulse (!) 104   Ht 5\' 7"  (1.702 m)   Wt 221 lb (100.2 kg)   LMP 09/30/2014   SpO2 96%   BMI 34.61 kg/m   Neurological Exam: MENTAL STATUS including orientation to time, place, person, recent and remote memory, attention span and concentration, language, and fund of knowledge is normal.  Speech is not dysarthric.  CRANIAL NERVES:  Pupils equal round and reactive to light.  Normal conjugate, extra-ocular eye movements in all directions of gaze.  No ptosis. Facial muscles - frontalis 5/5,  orbicularis oculi 5/5, buccinator 5-/5, orbicularis oris 5/5.  She is unable to whistle (old).  MOTOR:  There is left periscapular atrophy and left shoulder sits lower as compared to the right.  There are no fasciculations or abnormal movements.  Tone is normal.  Neck flexion is 5-/5  Right Upper Extremity:    Left Upper Extremity:    Deltoid  4+/5   Deltoid  4/5   Biceps  5/5   Biceps  5-/5   Triceps  5/5   Triceps  4/5   Medial pectoralis 5-/5  Medial pectoralis 4/5  Infraspinatus 5/5  Infraspinatus  4/5  Wrist extensors  5/5   Wrist extensors  5/5   Wrist flexors  5/5   Wrist flexors  5/5   Finger extensors  5/5   Finger extensors  5/5   Finger flexors  5/5   Finger flexors  5/5   Dorsal interossei  5/5   Dorsal interossei  5/5   Abductor pollicis  5/5   Abductor pollicis  5/5   Tone (Ashworth scale)  0  Tone (Ashworth scale)  0   Right Lower Extremity:    Left Lower Extremity:    Hip flexors  5-/5   Hip flexors  5-/5   Hip extensors  5/5   Hip extensors  5/5   Adductor 5/5  Adductor 5/5  Abductor 4+/5  Abductor 4+/5  Knee flexors  5/5   Knee flexors  5/5   Knee extensors  5/5   Knee extensors  5/5   Dorsiflexors  5/5   Dorsiflexors  4+/5   Plantarflexors  5/5   Plantarflexors  5/5   Toe extensors  5/5   Toe extensors  5-/5   Toe flexors  5/5   Toe flexors  5/5   Tone (Ashworth scale)  0  Tone (Ashworth scale)  0   MSRs:  Reflexes are 2+/4 throughout.  COORDINATION/GAIT:  Normal finger-to- nose-finger.  She is able to stand to rise without pushing off with arms.  Gait appears wide-based, mild waddling, left foot dragging, and unsteady, even with cane.     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 Zhan is a 61 year-old female returning for evaluation of long history of limb-girdle pattern  of weakness who genetic testing returned positive for fascioscapulohumeral muscular dystrophy. Patient was educated on the diagnosis, genetics, and management options.  She had many questions which were answered to the best of my ability.   She is mostly bothered by her proximal arm and leg weakness.  She had completed physical therapy with limited benefit.  She is also very concerned about musculoskeletal pain involving the hips.  She has seen orthopeadics who did not see any pathology intrinsic to the knee and is now seeing Pain Management for injections, but this only provides temporary relief.  She was given Mobic 7.5mg  1-2 tablets for her hip pain.  She is understandably concerned about her daughter having the disease and I recommend that she talk to a genetic counselor before getting tested, especially if she is not symptomatic.  She will be referred to [facility] for ongoing care and management.    Return to clinic as needed  The duration of this appointment visit was 40 minutes of face-to-face time with the patient.  Greater than 50% of this time was spent in counseling, explanation of diagnosis, planning of further management, and coordination of care.   Thank you for allowing me to participate in patient's care.  If I can answer any additional questions, I would be pleased to do so.    Sincerely,    Adore Kithcart K. Posey Pronto, DO

## 2016-12-21 NOTE — Patient Instructions (Signed)
We will refer you to [facility] at [facility]

## 2016-12-23 DIAGNOSIS — M47816 Spondylosis without myelopathy or radiculopathy, lumbar region: Secondary | ICD-10-CM | POA: Diagnosis not present

## 2016-12-30 DIAGNOSIS — Z1231 Encounter for screening mammogram for malignant neoplasm of breast: Secondary | ICD-10-CM | POA: Diagnosis not present

## 2016-12-30 DIAGNOSIS — Z01419 Encounter for gynecological examination (general) (routine) without abnormal findings: Secondary | ICD-10-CM | POA: Diagnosis not present

## 2016-12-30 DIAGNOSIS — N959 Unspecified menopausal and perimenopausal disorder: Secondary | ICD-10-CM | POA: Diagnosis not present

## 2017-01-10 ENCOUNTER — Encounter: Payer: Self-pay | Admitting: Neurology

## 2017-01-24 DIAGNOSIS — M47816 Spondylosis without myelopathy or radiculopathy, lumbar region: Secondary | ICD-10-CM | POA: Diagnosis not present

## 2017-02-24 DIAGNOSIS — M47816 Spondylosis without myelopathy or radiculopathy, lumbar region: Secondary | ICD-10-CM | POA: Diagnosis not present

## 2017-03-07 ENCOUNTER — Other Ambulatory Visit: Payer: Self-pay | Admitting: Neurology

## 2017-03-08 ENCOUNTER — Other Ambulatory Visit: Payer: Self-pay | Admitting: *Deleted

## 2017-03-08 MED ORDER — NAPROXEN 500 MG PO TABS: 500.0000 mg | ORAL_TABLET | Freq: Every day | ORAL | 5 refills | Status: DC | PRN

## 2017-03-18 ENCOUNTER — Other Ambulatory Visit: Payer: Self-pay | Admitting: Internal Medicine

## 2017-03-31 DIAGNOSIS — M47816 Spondylosis without myelopathy or radiculopathy, lumbar region: Secondary | ICD-10-CM | POA: Diagnosis not present

## 2017-03-31 DIAGNOSIS — I10 Essential (primary) hypertension: Secondary | ICD-10-CM | POA: Diagnosis not present

## 2017-04-18 ENCOUNTER — Encounter: Payer: 59 | Admitting: Internal Medicine

## 2017-04-21 DIAGNOSIS — G7102 Facioscapulohumeral muscular dystrophy: Secondary | ICD-10-CM | POA: Insufficient documentation

## 2017-04-21 NOTE — Progress Notes (Signed)
Subjective:    Patient ID: Lisa [name], female    DOB: 1955-12-15, 62 y.o.   MRN: [id number]  HPI She is here for a physical exam.   She is having difficulty sleeping due to her hip pain from muscular dystrophy.  She takes celebrex daily and tramadol as needed.  She has tried gabapentin in the past week-it was her husband's and it helped.  She wondered about taking that at night.  She is following with pain management.  She also follows with a muscular dystrophy specialist at [facility].  She would like to restart the cholesterol medication.  She was also wondering about her previous diagnosis with hepatitis C.  Depression screen PHQ 2/9 04/22/2017  Decreased Interest 0  Down, Depressed, Hopeless 0  PHQ - 2 Score 0   Screened for depression using the PHQ 2 scale.  No evidence of depression.   Medications and allergies reviewed with patient and updated if appropriate.  Patient Active Problem List   Diagnosis Date Noted  . Fascioscapulohumeral muscular dystrophy 04/21/2017  . Prediabetes 04/28/2016  . Hepatitis C antibody test positive 04/28/2016  . DOE (dyspnea on exertion) 04/23/2016  . Left hip pain 04/23/2016  . Leg heaviness 04/23/2016  . Right arm pain 04/23/2016  . Arthralgia 04/23/2016  . TMJ arthralgia 05/22/2015  . Hyperlipidemia 12/19/2014  . Essential hypertension 12/19/2014  . History of colonic polyps 12/19/2014  . Left lumbar radiculopathy 05/27/2009  . Left foot drop 05/27/2009    Current Outpatient Medications on File Prior to Visit  Medication Sig Dispense Refill  . aspirin (ASPIRIN EC) 81 MG EC tablet Take 81 mg by mouth daily. Swallow whole.    . calcium carbonate (TUMS - DOSED IN MG ELEMENTAL CALCIUM) 500 MG chewable tablet Chew 1 tablet by mouth as needed for indigestion or heartburn.    . celecoxib (CELEBREX) 200 MG capsule Take 200 mg by mouth daily.    . Magnesium 250 MG TABS Take by mouth daily.    . Multiple Vitamin (MULTI VITAMIN DAILY  PO) Take by mouth.    . naproxen (NAPROSYN) 500 MG tablet Take 1 tablet (500 mg total) by mouth daily as needed. 30 tablet 5  . Omega-3 Fatty Acids (FISH OIL) 1200 MG CPDR Take 1,200 mg by mouth daily.    . SOOLANTRA 1 % CREA APP AA ON THE FACE QD  3  . traMADol (ULTRAM) 50 MG tablet Take 50 mg by mouth every 6 (six) hours as needed.    . Vitamin D, Cholecalciferol, 1000 UNITS TABS Take 1,000 Units by mouth daily.    . [DISCONTINUED] losartan-hydrochlorothiazide (HYZAAR) 50-12.5 MG tablet Take 1 tablet by mouth daily. 90 tablet 3   No current facility-administered medications on file prior to visit.     Past Medical History:  Diagnosis Date  . Cataract    removed from both eyes  . GERD (gastroesophageal reflux disease)    no issue while on tums  . Heart murmur   . Heavy menses    irregular; on Provera  . Hyperlipidemia    diet controlled  . Hypertension     Past Surgical History:  Procedure Laterality Date  . CATARACT EXTRACTION Bilateral   . CESAREAN SECTION     X2  . COLONOSCOPY  2008   [facility]  . LASER ABLATION CONDYLOMA CERVICAL / VULVAR    . LASIK Bilateral   . LUMBAR DISC SURGERY  2011   L4-5 ; Dr Shellia Carwin  .  POLYPECTOMY    . VITRECTOMY Left 01-2015    Social History   Socioeconomic History  . Marital status: Married    Spouse name: None  . Number of children: 2  . Years of education: 54  . Highest education level: None  Social Needs  . Financial resource strain: None  . Food insecurity - worry: None  . Food insecurity - inability: None  . Transportation needs - medical: None  . Transportation needs - non-medical: None  Occupational History  . Occupation: retired Print production planner  Tobacco Use  . Smoking status: Never Smoker  . Smokeless tobacco: Never Used  Substance and Sexual Activity  . Alcohol use: No    Alcohol/week: 0.0 oz  . Drug use: No  . Sexual activity: None  Other Topics Concern  . None  Social History Narrative   Lives with  husband in a 2 story home.  Has 2 children.  Retired Print production planner.  Education: college degree.     Family History  Problem Relation Age of Onset  . Thyroid cancer Mother   . Hypertension Mother   . Colon polyps Father   . Hyperlipidemia Father   . Atrial fibrillation Father        has PPM  . Diabetes Paternal Uncle   . Diabetes Paternal Grandmother   . Heart attack Paternal Grandmother        <65  . Stroke Paternal Grandfather   . Hypertension Sister   . Hyperlipidemia Sister   . Muscular dystrophy Cousin   . Colon cancer Neg Hx   . Esophageal cancer Neg Hx   . Rectal cancer Neg Hx   . Stomach cancer Neg Hx     Review of Systems  Constitutional: Negative for chills, fatigue and fever.  Eyes: Negative for visual disturbance.  Respiratory: Negative for cough, shortness of breath and wheezing.   Cardiovascular: Positive for palpitations (rare) and leg swelling (left foot - foot drop). Negative for chest pain.  Gastrointestinal: Negative for abdominal pain, blood in stool, constipation, diarrhea and nausea.       Occ gerd  Genitourinary: Negative for dysuria and hematuria.  Musculoskeletal: Positive for arthralgias, gait problem and myalgias.  Skin: Negative for color change and rash.  Neurological: Positive for weakness. Negative for dizziness, light-headedness, numbness and headaches.  Psychiatric/Behavioral: Negative for dysphoric mood. The patient is not nervous/anxious.        Objective:   Vitals:   04/22/17 0904  BP: 112/80  Pulse: 88  Resp: 16  Temp: 97.9 F (36.6 C)  SpO2: 97%   Filed Weights   04/22/17 0904  Weight: 227 lb (103 kg)   Body mass index is 35.55 kg/m.  Wt Readings from Last 3 Encounters:  04/22/17 227 lb (103 kg)  12/21/16 221 lb (100.2 kg)  06/21/16 229 lb (103.9 kg)     Physical Exam Constitutional: She appears well-developed and well-nourished. No distress.  HENT:  Head: Normocephalic and atraumatic.  Right Ear: External  ear normal. Normal ear canal and TM Left Ear: External ear normal.  Normal ear canal and TM Mouth/Throat: Oropharynx is clear and moist.  Eyes: Conjunctivae and EOM are normal.  Neck: Neck supple. No tracheal deviation present. No thyromegaly present.  No carotid bruit  Cardiovascular: Normal rate, regular rhythm and normal heart sounds.   No murmur heard.  No edema. Pulmonary/Chest: Effort normal and breath sounds normal. No respiratory distress. She has no wheezes. She has no rales.  Breast: deferred to  Gyn Abdominal: Soft. She exhibits no distension. There is no tenderness.  Lymphadenopathy: She has no cervical adenopathy.  Skin: Skin is warm and dry. She is not diaphoretic.  Psychiatric: She has a normal mood and affect. Her behavior is normal.        Assessment & Plan:   Physical exam: Screening blood work ordered Immunizations   tetanus and flu shots due, discussed shingles vaccine Colonoscopy    up-to-date Mammogram up-to-date   Gyn    up-to-date  Dexa      screening can start at 15 Eye exams  Due - will schedule EKG   done 04/2016   Exercise   None,   trying to be active Weight   stressed the importance of weight loss Skin no concerns, sees derm annually Substance abuse    none  See Problem List for Assessment and Plan of chronic medical problems.      Follow-up in 6 months

## 2017-04-21 NOTE — Patient Instructions (Addendum)
Test(s) ordered today. Your results will be released to MyChart (or called to you) after review, usually within 72hours after test completion. If any changes need to be made, you will be notified at that same time.  All other Health Maintenance issues reviewed.   All recommended immunizations and age-appropriate screenings are up-to-date or discussed.  No immunizations administered today.   Medications reviewed and updated.  Changes include starting gabapentin 100 mg at night  Your prescription(s) have been submitted to your pharmacy. Please take as directed and contact our office if you believe you are having problem(s) with the medication(s).   Please followup in 6 months  Health Maintenance, Female Adopting a healthy lifestyle and getting preventive care can go a long way to promote health and wellness. Talk with your health care provider about what schedule of regular examinations is right for you. This is a good chance for you to check in with your provider about disease prevention and staying healthy. In between checkups, there are plenty of things you can do on your own. Experts have done a lot of research about which lifestyle changes and preventive measures are most likely to keep you healthy. Ask your health care provider for more information. Weight and diet Eat a healthy diet  Be sure to include plenty of vegetables, fruits, low-fat dairy products, and lean protein.  Do not eat a lot of foods high in solid fats, added sugars, or salt.  Get regular exercise. This is one of the most important things you can do for your health. ? Most adults should exercise for at least 150 minutes each week. The exercise should increase your heart rate and make you sweat (moderate-intensity exercise). ? Most adults should also do strengthening exercises at least twice a week. This is in addition to the moderate-intensity exercise.  Maintain a healthy weight  Body mass index (BMI) is a  measurement that can be used to identify possible weight problems. It estimates body fat based on height and weight. Your health care provider can help determine your BMI and help you achieve or maintain a healthy weight.  For females 20 years of age and older: ? A BMI below 18.5 is considered underweight. ? A BMI of 18.5 to 24.9 is normal. ? A BMI of 25 to 29.9 is considered overweight. ? A BMI of 30 and above is considered obese.  Watch levels of cholesterol and blood lipids  You should start having your blood tested for lipids and cholesterol at 63 years of age, then have this test every 5 years.  You may need to have your cholesterol levels checked more often if: ? Your lipid or cholesterol levels are high. ? You are older than 63 years of age. ? You are at high risk for heart disease.  Cancer screening Lung Cancer  Lung cancer screening is recommended for adults 55-80 years old who are at high risk for lung cancer because of a history of smoking.  A yearly low-dose CT scan of the lungs is recommended for people who: ? Currently smoke. ? Have quit within the past 15 years. ? Have at least a 30-pack-year history of smoking. A pack year is smoking an average of one pack of cigarettes a day for 1 year.  Yearly screening should continue until it has been 15 years since you quit.  Yearly screening should stop if you develop a health problem that would prevent you from having lung cancer treatment.  Breast Cancer  Practice breast   self-awareness. This means understanding how your breasts normally appear and feel.  It also means doing regular breast self-exams. Let your health care provider know about any changes, no matter how small.  If you are in your 20s or 30s, you should have a clinical breast exam (CBE) by a health care provider every 1-3 years as part of a regular health exam.  If you are 53 or older, have a CBE every year. Also consider having a breast X-ray (mammogram)  every year.  If you have a family history of breast cancer, talk to your health care provider about genetic screening.  If you are at high risk for breast cancer, talk to your health care provider about having an MRI and a mammogram every year.  Breast cancer gene (BRCA) assessment is recommended for women who have family members with BRCA-related cancers. BRCA-related cancers include: ? Breast. ? Ovarian. ? Tubal. ? Peritoneal cancers.  Results of the assessment will determine the need for genetic counseling and BRCA1 and BRCA2 testing.  Cervical Cancer Your health care provider may recommend that you be screened regularly for cancer of the pelvic organs (ovaries, uterus, and vagina). This screening involves a pelvic examination, including checking for microscopic changes to the surface of your cervix (Pap test). You may be encouraged to have this screening done every 3 years, beginning at age 61.  For women ages 4-65, health care providers may recommend pelvic exams and Pap testing every 3 years, or they may recommend the Pap and pelvic exam, combined with testing for human papilloma virus (HPV), every 5 years. Some types of HPV increase your risk of cervical cancer. Testing for HPV may also be done on women of any age with unclear Pap test results.  Other health care providers may not recommend any screening for nonpregnant women who are considered low risk for pelvic cancer and who do not have symptoms. Ask your health care provider if a screening pelvic exam is right for you.  If you have had past treatment for cervical cancer or a condition that could lead to cancer, you need Pap tests and screening for cancer for at least 20 years after your treatment. If Pap tests have been discontinued, your risk factors (such as having a new sexual partner) need to be reassessed to determine if screening should resume. Some women have medical problems that increase the chance of getting cervical  cancer. In these cases, your health care provider may recommend more frequent screening and Pap tests.  Colorectal Cancer  This type of cancer can be detected and often prevented.  Routine colorectal cancer screening usually begins at 63 years of age and continues through 63 years of age.  Your health care provider may recommend screening at an earlier age if you have risk factors for colon cancer.  Your health care provider may also recommend using home test kits to check for hidden blood in the stool.  A small camera at the end of a tube can be used to examine your colon directly (sigmoidoscopy or colonoscopy). This is done to check for the earliest forms of colorectal cancer.  Routine screening usually begins at age 56.  Direct examination of the colon should be repeated every 5-10 years through 63 years of age. However, you may need to be screened more often if early forms of precancerous polyps or small growths are found.  Skin Cancer  Check your skin from head to toe regularly.  Tell your health care provider about  any new moles or changes in moles, especially if there is a change in a mole's shape or color.  Also tell your health care provider if you have a mole that is larger than the size of a pencil eraser.  Always use sunscreen. Apply sunscreen liberally and repeatedly throughout the day.  Protect yourself by wearing long sleeves, pants, a wide-brimmed hat, and sunglasses whenever you are outside.  Heart disease, diabetes, and high blood pressure  High blood pressure causes heart disease and increases the risk of stroke. High blood pressure is more likely to develop in: ? People who have blood pressure in the high end of the normal range (130-139/85-89 mm Hg). ? People who are overweight or obese. ? People who are African American.  If you are 79-59 years of age, have your blood pressure checked every 3-5 years. If you are 70 years of age or older, have your blood  pressure checked every year. You should have your blood pressure measured twice-once when you are at a hospital or clinic, and once when you are not at a hospital or clinic. Record the average of the two measurements. To check your blood pressure when you are not at a hospital or clinic, you can use: ? An automated blood pressure machine at a pharmacy. ? A home blood pressure monitor.  If you are between 63 years and 9 years old, ask your health care provider if you should take aspirin to prevent strokes.  Have regular diabetes screenings. This involves taking a blood sample to check your fasting blood sugar level. ? If you are at a normal weight and have a low risk for diabetes, have this test once every three years after 63 years of age. ? If you are overweight and have a high risk for diabetes, consider being tested at a younger age or more often. Preventing infection Hepatitis B  If you have a higher risk for hepatitis B, you should be screened for this virus. You are considered at high risk for hepatitis B if: ? You were born in a country where hepatitis B is common. Ask your health care provider which countries are considered high risk. ? Your parents were born in a high-risk country, and you have not been immunized against hepatitis B (hepatitis B vaccine). ? You have HIV or AIDS. ? You use needles to inject street drugs. ? You live with someone who has hepatitis B. ? You have had sex with someone who has hepatitis B. ? You get hemodialysis treatment. ? You take certain medicines for conditions, including cancer, organ transplantation, and autoimmune conditions.  Hepatitis C  Blood testing is recommended for: ? Everyone born from [birth year] through 1966. ? Anyone with known risk factors for hepatitis C.  Sexually transmitted infections (STIs)  You should be screened for sexually transmitted infections (STIs) including gonorrhea and chlamydia if: ? You are sexually active and are  younger than 63 years of age. ? You are older than 63 years of age and your health care provider tells you that you are at risk for this type of infection. ? Your sexual activity has changed since you were last screened and you are at an increased risk for chlamydia or gonorrhea. Ask your health care provider if you are at risk.  If you do not have HIV, but are at risk, it may be recommended that you take a prescription medicine daily to prevent HIV infection. This is called pre-exposure prophylaxis (PrEP). You are considered  at risk if: ? You are sexually active and do not regularly use condoms or know the HIV status of your partner(s). ? You take drugs by injection. ? You are sexually active with a partner who has HIV.  Talk with your health care provider about whether you are at high risk of being infected with HIV. If you choose to begin PrEP, you should first be tested for HIV. You should then be tested every 3 months for as long as you are taking PrEP. Pregnancy  If you are premenopausal and you may become pregnant, ask your health care provider about preconception counseling.  If you may become pregnant, take 400 to 800 micrograms (mcg) of folic acid every day.  If you want to prevent pregnancy, talk to your health care provider about birth control (contraception). Osteoporosis and menopause  Osteoporosis is a disease in which the bones lose minerals and strength with aging. This can result in serious bone fractures. Your risk for osteoporosis can be identified using a bone density scan.  If you are 66 years of age or older, or if you are at risk for osteoporosis and fractures, ask your health care provider if you should be screened.  Ask your health care provider whether you should take a calcium or vitamin D supplement to lower your risk for osteoporosis.  Menopause may have certain physical symptoms and risks.  Hormone replacement therapy may reduce some of these symptoms and  risks. Talk to your health care provider about whether hormone replacement therapy is right for you. Follow these instructions at home:  Schedule regular health, dental, and eye exams.  Stay current with your immunizations.  Do not use any tobacco products including cigarettes, chewing tobacco, or electronic cigarettes.  If you are pregnant, do not drink alcohol.  If you are breastfeeding, limit how much and how often you drink alcohol.  Limit alcohol intake to no more than 1 drink per day for nonpregnant women. One drink equals 12 ounces of beer, 5 ounces of wine, or 1 ounces of hard liquor.  Do not use street drugs.  Do not share needles.  Ask your health care provider for help if you need support or information about quitting drugs.  Tell your health care provider if you often feel depressed.  Tell your health care provider if you have ever been abused or do not feel safe at home. This information is not intended to replace advice given to you by your health care provider. Make sure you discuss any questions you have with your health care provider. Document Released: 09/21/2010 Document Revised: 08/14/2015 Document Reviewed: 12/10/2014 Elsevier Interactive Patient Education  [employer].

## 2017-04-22 ENCOUNTER — Encounter: Payer: Self-pay | Admitting: Internal Medicine

## 2017-04-22 ENCOUNTER — Ambulatory Visit (INDEPENDENT_AMBULATORY_CARE_PROVIDER_SITE_OTHER): Payer: 59 | Admitting: Internal Medicine

## 2017-04-22 ENCOUNTER — Other Ambulatory Visit (INDEPENDENT_AMBULATORY_CARE_PROVIDER_SITE_OTHER): Payer: 59

## 2017-04-22 VITALS — BP 112/80 | HR 88 | Temp 97.9°F | Resp 16 | Ht 67.0 in | Wt 227.0 lb

## 2017-04-22 DIAGNOSIS — Z0000 Encounter for general adult medical examination without abnormal findings: Secondary | ICD-10-CM

## 2017-04-22 DIAGNOSIS — G7102 Facioscapulohumeral muscular dystrophy: Secondary | ICD-10-CM | POA: Diagnosis not present

## 2017-04-22 DIAGNOSIS — R768 Other specified abnormal immunological findings in serum: Secondary | ICD-10-CM

## 2017-04-22 DIAGNOSIS — R7303 Prediabetes: Secondary | ICD-10-CM

## 2017-04-22 DIAGNOSIS — E7849 Other hyperlipidemia: Secondary | ICD-10-CM

## 2017-04-22 DIAGNOSIS — I10 Essential (primary) hypertension: Secondary | ICD-10-CM

## 2017-04-22 DIAGNOSIS — Z1331 Encounter for screening for depression: Secondary | ICD-10-CM | POA: Diagnosis not present

## 2017-04-22 DIAGNOSIS — Z1159 Encounter for screening for other viral diseases: Secondary | ICD-10-CM | POA: Diagnosis not present

## 2017-04-22 DIAGNOSIS — R945 Abnormal results of liver function studies: Secondary | ICD-10-CM | POA: Diagnosis not present

## 2017-04-22 LAB — LIPID PANEL
CHOL/HDL RATIO: 6
Cholesterol: 303 mg/dL — ABNORMAL HIGH (ref 0–200)
HDL: 51.9 mg/dL (ref >39.00)
LDL CALC: 213 mg/dL — AB (ref 0–99)
NONHDL: 250.94
Triglycerides: 191 mg/dL — ABNORMAL HIGH (ref 0.0–149.0)
VLDL: 38.2 mg/dL (ref 0.0–40.0)

## 2017-04-22 LAB — COMPREHENSIVE METABOLIC PANEL
ALT: 33 U/L (ref 0–35)
AST: 20 U/L (ref 0–37)
Albumin: 4.2 g/dL (ref 3.5–5.2)
Alkaline Phosphatase: 83 U/L (ref 39–117)
BILIRUBIN TOTAL: 0.6 mg/dL (ref 0.2–1.2)
BUN: 16 mg/dL (ref 6–23)
CO2: 27 meq/L (ref 19–32)
CREATININE: 0.6 mg/dL (ref 0.40–1.20)
Calcium: 9.6 mg/dL (ref 8.4–10.5)
Chloride: 100 mEq/L (ref 96–112)
GFR: 107.63 mL/min (ref >60.00)
GLUCOSE: 114 mg/dL — AB (ref 70–99)
Potassium: 3.6 mEq/L (ref 3.5–5.1)
Sodium: 138 mEq/L (ref 135–145)
Total Protein: 7.5 g/dL (ref 6.0–8.3)

## 2017-04-22 LAB — CBC WITH DIFFERENTIAL/PLATELET
BASOS ABS: 0.1 10*3/uL (ref 0.0–0.1)
Basophils Relative: 0.6 % (ref 0.0–3.0)
Eosinophils Absolute: 0.2 10*3/uL (ref 0.0–0.7)
Eosinophils Relative: 1.8 % (ref 0.0–5.0)
HCT: 43.3 % (ref 36.0–46.0)
HEMOGLOBIN: 14.7 g/dL (ref 12.0–15.0)
Lymphocytes Relative: 24.1 % (ref 12.0–46.0)
Lymphs Abs: 2.1 10*3/uL (ref 0.7–4.0)
MCHC: 34 g/dL (ref 30.0–36.0)
MCV: 89.3 fl (ref 78.0–100.0)
MONO ABS: 0.7 10*3/uL (ref 0.1–1.0)
MONOS PCT: 7.4 % (ref 3.0–12.0)
NEUTROS PCT: 66.1 % (ref 43.0–77.0)
Neutro Abs: 5.9 10*3/uL (ref 1.4–7.7)
Platelets: 286 10*3/uL (ref 150.0–400.0)
RBC: 4.84 Mil/uL (ref 3.87–5.11)
RDW: 13.9 % (ref 11.5–15.5)
WBC: 8.9 10*3/uL (ref 4.0–10.5)

## 2017-04-22 LAB — HEMOGLOBIN A1C: HEMOGLOBIN A1C: 5.8 % (ref 4.6–6.5)

## 2017-04-22 LAB — TSH: TSH: 1.44 u[IU]/mL (ref 0.35–4.50)

## 2017-04-22 MED ORDER — LOSARTAN POTASSIUM 100 MG PO TABS: 100.0000 mg | ORAL_TABLET | Freq: Every day | ORAL | 3 refills | Status: DC

## 2017-04-22 MED ORDER — GABAPENTIN 100 MG PO CAPS: 100.0000 mg | ORAL_CAPSULE | Freq: Three times a day (TID) | ORAL | 3 refills | Status: DC

## 2017-04-22 MED ORDER — HYDROCHLOROTHIAZIDE 25 MG PO TABS: ORAL_TABLET | ORAL | 3 refills | Status: DC

## 2017-04-22 MED ORDER — ATORVASTATIN CALCIUM 20 MG PO TABS: 20.0000 mg | ORAL_TABLET | Freq: Every day | ORAL | 3 refills | Status: DC

## 2017-04-22 NOTE — Assessment & Plan Note (Signed)
Blood pressure well controlled Continue current medication CMP

## 2017-04-22 NOTE — Assessment & Plan Note (Signed)
Restart Lipitor Check cholesterol panel and CMP

## 2017-04-22 NOTE — Assessment & Plan Note (Addendum)
Taking celebrex daily Taking tramadol as needed Following with neurology and pain management We will start gabapentin 100 mg at night to help with some of her pain and her sleep

## 2017-04-22 NOTE — Assessment & Plan Note (Addendum)
?    False positive Recheck antibody We will also check for hepatitis A and B

## 2017-04-22 NOTE — Assessment & Plan Note (Signed)
Check a1c Low sugar / carb diet Stressed regular exercise, keeping weight down/weight loss 

## 2017-04-23 ENCOUNTER — Encounter: Payer: Self-pay | Admitting: Internal Medicine

## 2017-04-23 LAB — HEPATITIS B SURFACE ANTIBODY,QUALITATIVE: HEP B S AB: NONREACTIVE

## 2017-04-23 LAB — HEPATITIS A ANTIBODY, TOTAL: Hepatitis A AB,Total: NONREACTIVE

## 2017-04-23 LAB — HEPATITIS C ANTIBODY
HEP C AB: NONREACTIVE
SIGNAL TO CUT-OFF: 0.91 (ref <1.00)

## 2017-04-23 LAB — HEPATITIS B SURFACE ANTIGEN: Hepatitis B Surface Ag: NONREACTIVE

## 2017-06-13 ENCOUNTER — Encounter: Payer: Self-pay | Admitting: Internal Medicine

## 2017-06-13 DIAGNOSIS — M25552 Pain in left hip: Secondary | ICD-10-CM

## 2017-06-24 ENCOUNTER — Encounter: Payer: Self-pay | Admitting: Internal Medicine

## 2017-06-24 NOTE — Telephone Encounter (Signed)
Letter written.  Please fax.  Please let patient know.

## 2017-06-27 DIAGNOSIS — M1612 Unilateral primary osteoarthritis, left hip: Secondary | ICD-10-CM | POA: Diagnosis not present

## 2017-06-30 DIAGNOSIS — M47816 Spondylosis without myelopathy or radiculopathy, lumbar region: Secondary | ICD-10-CM | POA: Diagnosis not present

## 2017-07-06 DIAGNOSIS — L409 Psoriasis, unspecified: Secondary | ICD-10-CM | POA: Insufficient documentation

## 2017-07-06 DIAGNOSIS — M1612 Unilateral primary osteoarthritis, left hip: Secondary | ICD-10-CM | POA: Diagnosis not present

## 2017-07-06 DIAGNOSIS — M21372 Foot drop, left foot: Secondary | ICD-10-CM | POA: Diagnosis not present

## 2017-07-18 ENCOUNTER — Encounter: Payer: Self-pay | Admitting: Internal Medicine

## 2017-07-18 DIAGNOSIS — R7989 Other specified abnormal findings of blood chemistry: Secondary | ICD-10-CM | POA: Insufficient documentation

## 2017-07-18 DIAGNOSIS — Z1159 Encounter for screening for other viral diseases: Secondary | ICD-10-CM | POA: Insufficient documentation

## 2017-07-18 DIAGNOSIS — R945 Abnormal results of liver function studies: Secondary | ICD-10-CM

## 2017-09-08 DIAGNOSIS — R269 Unspecified abnormalities of gait and mobility: Secondary | ICD-10-CM | POA: Diagnosis not present

## 2017-10-03 DIAGNOSIS — M47816 Spondylosis without myelopathy or radiculopathy, lumbar region: Secondary | ICD-10-CM | POA: Diagnosis not present

## 2017-10-19 ENCOUNTER — Other Ambulatory Visit: Payer: Self-pay | Admitting: Internal Medicine

## 2017-10-27 ENCOUNTER — Ambulatory Visit: Payer: 59 | Admitting: Internal Medicine

## 2017-10-27 ENCOUNTER — Other Ambulatory Visit (INDEPENDENT_AMBULATORY_CARE_PROVIDER_SITE_OTHER): Payer: 59

## 2017-10-27 ENCOUNTER — Encounter: Payer: Self-pay | Admitting: Internal Medicine

## 2017-10-27 VITALS — BP 132/70 | HR 81 | Temp 98.0°F | Resp 16 | Wt 220.0 lb

## 2017-10-27 DIAGNOSIS — R6884 Jaw pain: Secondary | ICD-10-CM | POA: Insufficient documentation

## 2017-10-27 DIAGNOSIS — I739 Peripheral vascular disease, unspecified: Secondary | ICD-10-CM

## 2017-10-27 DIAGNOSIS — I10 Essential (primary) hypertension: Secondary | ICD-10-CM

## 2017-10-27 DIAGNOSIS — G458 Other transient cerebral ischemic attacks and related syndromes: Secondary | ICD-10-CM | POA: Insufficient documentation

## 2017-10-27 DIAGNOSIS — G7102 Facioscapulohumeral muscular dystrophy: Secondary | ICD-10-CM

## 2017-10-27 DIAGNOSIS — E7849 Other hyperlipidemia: Secondary | ICD-10-CM

## 2017-10-27 DIAGNOSIS — I779 Disorder of arteries and arterioles, unspecified: Secondary | ICD-10-CM | POA: Insufficient documentation

## 2017-10-27 DIAGNOSIS — R7303 Prediabetes: Secondary | ICD-10-CM

## 2017-10-27 DIAGNOSIS — I6523 Occlusion and stenosis of bilateral carotid arteries: Secondary | ICD-10-CM | POA: Diagnosis not present

## 2017-10-27 LAB — COMPREHENSIVE METABOLIC PANEL
ALBUMIN: 4.1 g/dL (ref 3.5–5.2)
ALK PHOS: 87 U/L (ref 39–117)
ALT: 33 U/L (ref 0–35)
AST: 20 U/L (ref 0–37)
BUN: 15 mg/dL (ref 6–23)
CO2: 29 mEq/L (ref 19–32)
CREATININE: 0.65 mg/dL (ref 0.40–1.20)
Calcium: 10.1 mg/dL (ref 8.4–10.5)
Chloride: 101 mEq/L (ref 96–112)
GFR: 97.97 mL/min (ref >60.00)
Glucose, Bld: 106 mg/dL — ABNORMAL HIGH (ref 70–99)
POTASSIUM: 3.4 meq/L — AB (ref 3.5–5.1)
SODIUM: 138 meq/L (ref 135–145)
TOTAL PROTEIN: 7.2 g/dL (ref 6.0–8.3)
Total Bilirubin: 0.7 mg/dL (ref 0.2–1.2)

## 2017-10-27 LAB — LIPID PANEL
CHOLESTEROL: 168 mg/dL (ref 0–200)
HDL: 46.3 mg/dL (ref >39.00)
LDL Cholesterol: 90 mg/dL (ref 0–99)
NonHDL: 121.45
TRIGLYCERIDES: 157 mg/dL — AB (ref 0.0–149.0)
Total CHOL/HDL Ratio: 4
VLDL: 31.4 mg/dL (ref 0.0–40.0)

## 2017-10-27 LAB — HEMOGLOBIN A1C: Hgb A1c MFr Bld: 5.8 % (ref 4.6–6.5)

## 2017-10-27 MED ORDER — ROSUVASTATIN CALCIUM 5 MG PO TABS: 5.0000 mg | ORAL_TABLET | Freq: Every day | ORAL | 3 refills | Status: DC

## 2017-10-27 NOTE — Assessment & Plan Note (Signed)
She follows at [facility] Left side week She has seen some progression and uses a walker most of the time to ambulate, sometimes a cane inside the house

## 2017-10-27 NOTE — Assessment & Plan Note (Signed)
BP well controlled Current regimen effective and well tolerated Continue current medications at current doses CMP 

## 2017-10-27 NOTE — Assessment & Plan Note (Signed)
She had an episode of jaw pain that radiated down the anterior side of her neck more than 1 month ago No recurrence The pain lasted approximately 2 days and resolved She does have chronic muscular pain from her muscular dystrophy, but states this felt different There is the possibility that this could be cardiac and given her risk factors will refer to cardiology for further evaluation

## 2017-10-27 NOTE — Assessment & Plan Note (Signed)
Ultrasound from years ago reviewed-she does have bilateral internal carotid artery stenosis Carotid artery ultrasound ordered Continue statin

## 2017-10-27 NOTE — Assessment & Plan Note (Signed)
Check A1c Low sugar/carbohydrate diet She is trying to stay as active as possible

## 2017-10-27 NOTE — Patient Instructions (Addendum)
  Test(s) ordered today. Your results will be released to [place] (or called to you) after review, usually within 72hours after test completion. If any changes need to be made, you will be notified at that same time.  Medications reviewed and updated.  Changes include stopping the lipitor and once your symptoms go away start the crestor at 3 days a week - increase to daily if you have no symptoms.    Your prescription(s) have been submitted to your pharmacy. Please take as directed and contact our office if you believe you are having problem(s) with the medication(s).  A referral was ordered for cardiology.  A carotid US was ordered.   Please followup in 6 months for a physical

## 2017-10-27 NOTE — Assessment & Plan Note (Addendum)
Previous carotid ultrasound shows left-sided subclavian stenosis Recheck carotid ultrasound She denies any lightheadedness or concerning symptoms with use of her arm.  Her left arm is the weaker arm and she does not use it that much

## 2017-10-27 NOTE — Assessment & Plan Note (Signed)
She did not tolerate atorvastatin 20 mg daily-she did have increased muscular pain Stop medication and make sure her symptoms resolved Will try Crestor 5 mg 3 times a week-she can try increasing if tolerated Check lipid panel, CMP today

## 2017-10-27 NOTE — Progress Notes (Signed)
Subjective:    Patient ID: Lisa [name], female    DOB: [birth year], 62 y.o.   MRN: [id number]  HPI The patient is here for follow up.  Muscular dystrophy:  She walks with a walker most of the time, with a cane at home.  Her muscular dystrophy is getting worse.  She is having increased fatigue as well, which is part of her muscular dystrophy.  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  The pain was on the right side and down her anterior neck.  She is unsure if it is from the muscular dystrophy ( she does have a lot of neck and shoulder pain, but it is typically in the posterior neck not anterior) or possibly cardiac.  It lasted a couple of days and went away.  It occurred over one month ago.  She has not had any recurrence.  Hip pain:  Still with hip pain.  She has seen neurosurgery and orthopedics and they are unsure what causes the pain.    Prediabetes:  She is compliant with a low sugar/carbohydrate diet.  She is not exercising regularly.  Hypertension: She is taking her medication daily. She is compliant with a low sodium diet.  She denies chest pain, palpitations, shortness of breath and regular headaches. She is not exercising regularly.  She does not monitor her blood pressure at home.    Hyperlipidemia: She is taking her medication daily,but she thinks it is causing some muscle pain. She is fairly compliant with a low fat/cholesterol diet. She is not exercising regularly.     Medications and allergies reviewed with patient and updated if appropriate.  Patient Active Problem List   Diagnosis Date Noted  . Carotid artery disease ([place]) 10/27/2017  . Subclavian steal syndrome 10/27/2017  . Elevated LFTs 07/18/2017  . Fascioscapulohumeral muscular dystrophy ([place]) 04/21/2017  . Prediabetes 04/28/2016  . Hepatitis C antibody test positive - false positive 04/28/2016  . Left hip pain 04/23/2016  . Leg heaviness 04/23/2016  . Right arm pain 04/23/2016  . Arthralgia 04/23/2016  . TMJ  arthralgia 05/22/2015  . Hyperlipidemia 12/19/2014  . Essential hypertension 12/19/2014  . History of colonic polyps 12/19/2014  . Left lumbar radiculopathy 05/27/2009  . Left foot drop 05/27/2009    Current Outpatient Medications on File Prior to Visit  Medication Sig Dispense Refill  . aspirin (ASPIRIN EC) 81 MG EC tablet Take 81 mg by mouth daily. Swallow whole.    . calcium carbonate (TUMS - DOSED IN MG ELEMENTAL CALCIUM) 500 MG chewable tablet Chew 1 tablet by mouth as needed for indigestion or heartburn.    . celecoxib (CELEBREX) 200 MG capsule Take 200 mg by mouth daily.    Marland Kitchen gabapentin (NEURONTIN) 100 MG capsule TAKE 1 CAPSULE(100 MG) BY MOUTH THREE TIMES DAILY 90 capsule 0  . hydrochlorothiazide (HYDRODIURIL) 25 MG tablet TAKE 1 TABLET(25 MG) BY MOUTH DAILY 90 tablet 3  . losartan (COZAAR) 100 MG tablet Take 1 tablet (100 mg total) by mouth daily. 90 tablet 3  . Magnesium 250 MG TABS Take by mouth daily.    . Multiple Vitamin (MULTI VITAMIN DAILY PO) Take by mouth.    . Omega-3 Fatty Acids (FISH OIL) 1200 MG CPDR Take 1,200 mg by mouth daily.    . SOOLANTRA 1 % CREA APP AA ON THE FACE QD  3  . traMADol (ULTRAM) 50 MG tablet Take 50 mg by mouth every 6 (six) hours as needed.    Marland Kitchen  Vitamin D, Cholecalciferol, 1000 UNITS TABS Take 1,000 Units by mouth daily.    . [DISCONTINUED] losartan-hydrochlorothiazide (HYZAAR) 50-12.5 MG tablet Take 1 tablet by mouth daily. 90 tablet 3   No current facility-administered medications on file prior to visit.     Past Medical History:  Diagnosis Date  . Cataract    removed from both eyes  . GERD (gastroesophageal reflux disease)    no issue while on tums  . Heart murmur   . Heavy menses    irregular; on Provera  . Hyperlipidemia    diet controlled  . Hypertension     Past Surgical History:  Procedure Laterality Date  . CATARACT EXTRACTION Bilateral   . CESAREAN SECTION     X2  . COLONOSCOPY  2008   [facility]  . LASER ABLATION  CONDYLOMA CERVICAL / VULVAR    . LASIK Bilateral   . LUMBAR DISC SURGERY  2011   L4-5 ; Dr Shellia Carwin  . POLYPECTOMY    . VITRECTOMY Left 01-2015    Social History   Socioeconomic History  . Marital status: Married    Spouse name: Not on file  . Number of children: 2  . Years of education: 42  . Highest education level: Not on file  Occupational History  . Occupation: retired Print production planner  Social Needs  . Financial resource strain: Not on file  . Food insecurity:    Worry: Not on file    Inability: Not on file  . Transportation needs:    Medical: Not on file    Non-medical: Not on file  Tobacco Use  . Smoking status: Never Smoker  . Smokeless tobacco: Never Used  Substance and Sexual Activity  . Alcohol use: No    Alcohol/week: 0.0 standard drinks  . Drug use: No  . Sexual activity: Not on file  Lifestyle  . Physical activity:    Days per week: Not on file    Minutes per session: Not on file  . Stress: Not on file  Relationships  . Social connections:    Talks on phone: Not on file    Gets together: Not on file    Attends religious service: Not on file    Active member of club or organization: Not on file    Attends meetings of clubs or organizations: Not on file    Relationship status: Not on file  Other Topics Concern  . Not on file  Social History Narrative   Lives with husband in a 2 story home.  Has 2 children.  Retired Print production planner.  Education: college degree.     Family History  Problem Relation Age of Onset  . Thyroid cancer Mother   . Hypertension Mother   . Colon polyps Father   . Hyperlipidemia Father   . Atrial fibrillation Father        has PPM  . Diabetes Paternal Uncle   . Diabetes Paternal Grandmother   . Heart attack Paternal Grandmother        <65  . Stroke Paternal Grandfather   . Hypertension Sister   . Hyperlipidemia Sister   . Muscular dystrophy Cousin   . Colon cancer Neg Hx   . Esophageal cancer Neg Hx   . Rectal  cancer Neg Hx   . Stomach cancer Neg Hx     Review of Systems  Constitutional: Positive for fatigue. Negative for chills and fever.  Respiratory: Negative for cough, shortness of breath and wheezing.  Cardiovascular: Positive for leg swelling (left leg - MD related). Negative for chest pain and palpitations.  Neurological: Positive for dizziness (when first stands up - off balanced) and light-headedness (when first stands up). Negative for headaches.       Objective:   Vitals:   10/27/17 1046  BP: 132/70  Pulse: 81  Resp: 16  Temp: 98 F (36.7 C)  SpO2: 96%   BP Readings from Last 3 Encounters:  10/27/17 132/70  04/22/17 112/80  12/21/16 130/80   Wt Readings from Last 3 Encounters:  10/27/17 220 lb (99.8 kg)  04/22/17 227 lb (103 kg)  12/21/16 221 lb (100.2 kg)   Body mass index is 34.46 kg/m.   Physical Exam    Constitutional: Appears well-developed and well-nourished. No distress.  HENT:  Head: Normocephalic and atraumatic.  Neck: Neck supple. No tracheal deviation present. No thyromegaly present.  No cervical lymphadenopathy Cardiovascular: Normal rate, regular rhythm and normal heart sounds.   No murmur heard. No carotid bruit .  Trace left lower extremity edema Pulmonary/Chest: Effort normal and breath sounds normal. No respiratory distress. No has no wheezes. No rales.  Skin: Skin is warm and dry. Not diaphoretic.  Psychiatric: Normal mood and affect. Behavior is normal.      Assessment & Plan:    See Problem List for Assessment and Plan of chronic medical problems.

## 2017-11-01 NOTE — Progress Notes (Signed)
Cardiology Office Note   Date:  11/01/2017   ID:  Lisa [name], DOB [birth year], MRN [id number]  PCP:  Binnie Rail, MD  Cardiologist:   No primary care provider on file. Referring:  Binnie Rail, MD   No chief complaint on file.     History of Present Illness: Lisa [name] is a 62 y.o. female who is referred by Binnie Rail, MD for evaluation of jaw pain. States about 1 month ago she had a 2 day episode of right shoulder pain that radiated to her right jaw and anterior neck. She has a history of muscular dystrophy and often has jaw and shoulder pain, but this was much different in that it was more intense, tight and "firm" feeling, and the neck pain was new. This pain was not associated exertion or with shortness of breath, diaphoresis, or nausea. The pain eventually dissipated in 2 days without much relief with tramadol. She never had this happen before. She did not and currently does not have chest pain, tightness, or pressure. Her most exerting activity is walking in the house or dusting as she is mostly limited in mobility due to her muscular dystrophy. She does not describe any heart palpitations, PND, orthopnea, or leg swelling. She has carotid ultrasound scheduled in September 2019.   Past Medical History:  Diagnosis Date  . Cataract    removed from both eyes  . GERD (gastroesophageal reflux disease)    no issue while on tums  . Heart murmur   . Heavy menses    irregular; on Provera  . Hyperlipidemia    diet controlled  . Hypertension     Past Surgical History:  Procedure Laterality Date  . CATARACT EXTRACTION Bilateral   . CESAREAN SECTION     X2  . COLONOSCOPY  2008   [facility]  . LASER ABLATION CONDYLOMA CERVICAL / VULVAR    . LASIK Bilateral   . LUMBAR DISC SURGERY  2011   L4-5 ; Dr Shellia Carwin  . POLYPECTOMY    . VITRECTOMY Left 01-2015     Current Outpatient Medications  Medication Sig Dispense Refill  . aspirin (ASPIRIN EC) 81 MG  EC tablet Take 81 mg by mouth daily. Swallow whole.    . calcium carbonate (TUMS - DOSED IN MG ELEMENTAL CALCIUM) 500 MG chewable tablet Chew 1 tablet by mouth as needed for indigestion or heartburn.    . celecoxib (CELEBREX) 200 MG capsule Take 200 mg by mouth daily.    Marland Kitchen gabapentin (NEURONTIN) 100 MG capsule TAKE 1 CAPSULE(100 MG) BY MOUTH THREE TIMES DAILY 90 capsule 0  . hydrochlorothiazide (HYDRODIURIL) 25 MG tablet TAKE 1 TABLET(25 MG) BY MOUTH DAILY 90 tablet 3  . losartan (COZAAR) 100 MG tablet Take 1 tablet (100 mg total) by mouth daily. 90 tablet 3  . Magnesium 250 MG TABS Take by mouth daily.    . Multiple Vitamin (MULTI VITAMIN DAILY PO) Take by mouth.    . Omega-3 Fatty Acids (FISH OIL) 1200 MG CPDR Take 1,200 mg by mouth daily.    . rosuvastatin (CRESTOR) 5 MG tablet Take 1 tablet (5 mg total) by mouth daily. 90 tablet 3  . SOOLANTRA 1 % CREA APP AA ON THE FACE QD  3  . traMADol (ULTRAM) 50 MG tablet Take 50 mg by mouth every 6 (six) hours as needed.    . Vitamin D, Cholecalciferol, 1000 UNITS TABS Take 1,000 Units by mouth daily.  No current facility-administered medications for this visit.     Allergies:   Lisinopril    Social History:  The patient  reports that she has never smoked. She has never used smokeless tobacco. She reports that she does not drink alcohol or use drugs.   Family History:  The patient's family history includes Atrial fibrillation in her father; Colon polyps in her father; Diabetes in her paternal grandmother and paternal uncle; Heart attack in her paternal grandmother; Hyperlipidemia in her father and sister; Hypertension in her mother and sister; Muscular dystrophy in her cousin; Stroke in her paternal grandfather; Thyroid cancer in her mother.    ROS:  Please see the history of present illness.   Otherwise, review of systems are positive for dizziness.   All other systems are reviewed and negative.    PHYSICAL EXAM: VS:  LMP 09/30/2014  , BMI  There is no height or weight on file to calculate BMI.  GENERAL:  Well appearing HEENT:  Pupils equal round and reactive, fundi not visualized, oral mucosa unremarkable NECK:  No jugular venous distention, waveform within normal limits, carotid upstroke brisk and symmetric, no bruits, no thyromegaly LYMPHATICS:  No cervical, inguinal adenopathy LUNGS:  Clear to auscultation bilaterally BACK:  No CVA tenderness CHEST:  Unremarkable HEART:  PMI not displaced or sustained,S1 and S2 within normal limits, no S3, no S4, no clicks, no rubs, 1 of 6 systolic murmurs best heard at right second intercostal space.  ABD:  Flat, positive bowel sounds normal in frequency in pitch, no bruits, no rebound, no guarding, no midline pulsatile mass, no hepatomegaly, no splenomegaly EXT:  2 plus pulses throughout, no edema, no cyanosis no clubbing SKIN:  No rashes no nodules NEURO:  Cranial nerves II through XII grossly intact, motor grossly intact throughout PSYCH:  Cognitively intact, oriented to person place and time    EKG:  EKG is ordered today. The ekg ordered today demonstrates normal sinus rhythm, rate 73. Normal axis, intervals.    Recent Labs: 04/22/2017: Hemoglobin 14.7; Platelets 286.0; TSH 1.44 10/27/2017: ALT 33; BUN 15; Creatinine, Ser 0.65; Potassium 3.4; Sodium 138    Lipid Panel    Component Value Date/Time   CHOL 168 10/27/2017 1141   TRIG 157.0 (H) 10/27/2017 1141   HDL 46.30 10/27/2017 1141   CHOLHDL 4 10/27/2017 1141   VLDL 31.4 10/27/2017 1141   LDLCALC 90 10/27/2017 1141   LDLDIRECT 191.0 05/13/2015 1215      Wt Readings from Last 3 Encounters:  10/27/17 220 lb (99.8 kg)  04/22/17 227 lb (103 kg)  12/21/16 221 lb (100.2 kg)      Other studies Reviewed: Additional studies/ records that were reviewed today include: labs. Review of the above records demonstrates: A1c: 5.8, cholesterol 168, triglycerides 157, HDL 46, LDL 90.  Please see elsewhere in the note.      ASSESSMENT AND PLAN:  JAW PAIN:  I do not feel this is cardiac in nature. Given her risk factors as well as age, I feel she would benefit from a calcium score. She has a carotid ultrasound scheduled in September 2019. If her calcium score is significant I will follow up with pharmacological stress test.   DYSLIPIDEMIA: on current medical therapy, no changes at this time. She was recently started on Crestor and discontinued Lipitor due to muscle aches.  HYPERTENSION: on current medical therapy. Well controlled, no changes at this time.   Current medicines are reviewed at length with the patient today.  The patient does not have concerns regarding medicines.  The following changes have been made:  no change  Labs/ tests ordered today include: EKG No orders of the defined types were placed in this encounter.    Disposition:   FU with me prn.    Signed, Minus Breeding, MD  11/01/2017 5:12 PM    [facility]

## 2017-11-02 ENCOUNTER — Encounter: Payer: Self-pay | Admitting: Cardiology

## 2017-11-02 ENCOUNTER — Ambulatory Visit: Payer: 59 | Admitting: Cardiology

## 2017-11-02 VITALS — BP 116/76 | HR 73 | Ht 67.0 in | Wt 222.0 lb

## 2017-11-02 DIAGNOSIS — R0602 Shortness of breath: Secondary | ICD-10-CM

## 2017-11-02 NOTE — Patient Instructions (Addendum)
Medication Instructions:  Continue current medications  If you need a refill on your cardiac medications before your next appointment, please call your pharmacy.  Labwork: None Ordered   Testing/Procedures: Your physician has requested that you have a Coronary Calcium Score. This test is done at our [facility].   Follow-Up: Your physician wants you to follow-up in: As Needed.      Thank you for choosing [facility] at [facility]!!

## 2017-11-03 DIAGNOSIS — M5416 Radiculopathy, lumbar region: Secondary | ICD-10-CM | POA: Diagnosis not present

## 2017-11-03 DIAGNOSIS — M47816 Spondylosis without myelopathy or radiculopathy, lumbar region: Secondary | ICD-10-CM | POA: Diagnosis not present

## 2017-11-22 ENCOUNTER — Other Ambulatory Visit: Payer: Self-pay | Admitting: Internal Medicine

## 2017-11-24 ENCOUNTER — Ambulatory Visit (INDEPENDENT_AMBULATORY_CARE_PROVIDER_SITE_OTHER)
Admission: RE | Admit: 2017-11-24 | Discharge: 2017-11-24 | Disposition: A | Discharge status: Home / Self Care | Payer: Self-pay | Source: Ambulatory Visit | Attending: Cardiology | Admitting: Cardiology

## 2017-11-24 DIAGNOSIS — R0602 Shortness of breath: Secondary | ICD-10-CM

## 2017-11-25 ENCOUNTER — Telehealth: Payer: Self-pay | Admitting: *Deleted

## 2017-11-25 ENCOUNTER — Telehealth: Payer: Self-pay | Admitting: Cardiology

## 2017-11-25 DIAGNOSIS — R0602 Shortness of breath: Secondary | ICD-10-CM

## 2017-11-25 NOTE — Telephone Encounter (Signed)
Lexiscan ordered and send to scheduling to be schedule

## 2017-11-25 NOTE — Telephone Encounter (Signed)
-----   Message from Minus Breeding, MD sent at 11/25/2017 11:54 AM EDT ----- Called with results of the calcium score.  Please schedule a Lexiscan Myoview.  Send results to Binnie Rail, MD

## 2017-11-25 NOTE — Telephone Encounter (Signed)
Called patient to schedule lexiscan, but, she did not have her calendar and will call back to schedule this.

## 2017-11-28 ENCOUNTER — Telehealth (HOSPITAL_COMMUNITY): Payer: Self-pay | Admitting: *Deleted

## 2017-11-28 NOTE — Telephone Encounter (Signed)
Patient given detailed instructions per Myocardial Perfusion Study Information Sheet for the test on 11/30/17 at 1230. Patient notified to arrive 15 minutes early and that it is imperative to arrive on time for appointment to keep from having the test rescheduled.  If you need to cancel or reschedule your appointment, please call the office within 24 hours of your appointment. . Patient verbalized understanding.Zidan Helget, Ranae Palms

## 2017-11-30 ENCOUNTER — Ambulatory Visit (HOSPITAL_COMMUNITY): Payer: 59 | Attending: Cardiovascular Disease

## 2017-11-30 DIAGNOSIS — I10 Essential (primary) hypertension: Secondary | ICD-10-CM | POA: Insufficient documentation

## 2017-11-30 DIAGNOSIS — I4510 Unspecified right bundle-branch block: Secondary | ICD-10-CM | POA: Diagnosis not present

## 2017-11-30 DIAGNOSIS — R0602 Shortness of breath: Secondary | ICD-10-CM | POA: Diagnosis not present

## 2017-11-30 MED ORDER — REGADENOSON 0.4 MG/5ML IV SOLN
0.4000 mg | Freq: Once | INTRAVENOUS | Status: AC
  Administered 2017-11-30: 0.4 mg via INTRAVENOUS

## 2017-11-30 MED ORDER — AMINOPHYLLINE 25 MG/ML IV SOLN
75.0000 mg | Freq: Once | INTRAVENOUS | Status: AC
  Administered 2017-11-30: 75 mg via INTRAVENOUS

## 2017-11-30 MED ORDER — TECHNETIUM TC 99M TETROFOSMIN IV KIT
32.6000 | PACK | Freq: Once | INTRAVENOUS | Status: AC | PRN
  Administered 2017-11-30: 32.6 via INTRAVENOUS
  Filled 2017-11-30: qty 33

## 2017-12-01 ENCOUNTER — Ambulatory Visit (HOSPITAL_COMMUNITY): Payer: 59

## 2017-12-01 ENCOUNTER — Ambulatory Visit (HOSPITAL_COMMUNITY)
Admission: RE | Admit: 2017-12-01 | Discharge: 2017-12-01 | Disposition: A | Discharge status: Home / Self Care | Payer: 59 | Source: Ambulatory Visit | Attending: Cardiology | Admitting: Cardiology

## 2017-12-01 DIAGNOSIS — I6523 Occlusion and stenosis of bilateral carotid arteries: Secondary | ICD-10-CM | POA: Diagnosis not present

## 2017-12-01 DIAGNOSIS — G458 Other transient cerebral ischemic attacks and related syndromes: Secondary | ICD-10-CM | POA: Insufficient documentation

## 2017-12-01 LAB — MYOCARDIAL PERFUSION IMAGING
CHL CUP NUCLEAR SSS: 1
CHL CUP RESTING HR STRESS: 84 {beats}/min
CSEPPHR: 99 {beats}/min
LV dias vol: 70 mL (ref 46–106)
LVSYSVOL: 22 mL
SDS: 1
SRS: 0
TID: 1.1

## 2017-12-01 MED ORDER — TECHNETIUM TC 99M TETROFOSMIN IV KIT
31.7000 | PACK | Freq: Once | INTRAVENOUS | Status: AC | PRN
  Administered 2017-12-01: 31.7 via INTRAVENOUS
  Filled 2017-12-01: qty 32

## 2017-12-03 ENCOUNTER — Encounter: Payer: Self-pay | Admitting: Internal Medicine

## 2017-12-08 DIAGNOSIS — M47816 Spondylosis without myelopathy or radiculopathy, lumbar region: Secondary | ICD-10-CM | POA: Diagnosis not present

## 2017-12-08 DIAGNOSIS — M5416 Radiculopathy, lumbar region: Secondary | ICD-10-CM | POA: Diagnosis not present

## 2017-12-08 DIAGNOSIS — I10 Essential (primary) hypertension: Secondary | ICD-10-CM | POA: Diagnosis not present

## 2017-12-26 ENCOUNTER — Other Ambulatory Visit: Payer: Self-pay | Admitting: Internal Medicine

## 2018-01-04 DIAGNOSIS — Z1231 Encounter for screening mammogram for malignant neoplasm of breast: Secondary | ICD-10-CM | POA: Diagnosis not present

## 2018-01-04 DIAGNOSIS — Z124 Encounter for screening for malignant neoplasm of cervix: Secondary | ICD-10-CM | POA: Diagnosis not present

## 2018-01-04 DIAGNOSIS — Z01419 Encounter for gynecological examination (general) (routine) without abnormal findings: Secondary | ICD-10-CM | POA: Diagnosis not present

## 2018-01-04 DIAGNOSIS — Z6835 Body mass index (BMI) 35.0-35.9, adult: Secondary | ICD-10-CM | POA: Diagnosis not present

## 2018-01-05 DIAGNOSIS — Z124 Encounter for screening for malignant neoplasm of cervix: Secondary | ICD-10-CM | POA: Diagnosis not present

## 2018-01-11 DIAGNOSIS — N950 Postmenopausal bleeding: Secondary | ICD-10-CM | POA: Diagnosis not present

## 2018-01-11 DIAGNOSIS — N939 Abnormal uterine and vaginal bleeding, unspecified: Secondary | ICD-10-CM | POA: Diagnosis not present

## 2018-02-04 ENCOUNTER — Other Ambulatory Visit: Payer: Self-pay | Admitting: Internal Medicine

## 2018-02-27 DIAGNOSIS — I10 Essential (primary) hypertension: Secondary | ICD-10-CM | POA: Diagnosis not present

## 2018-02-27 DIAGNOSIS — Z6835 Body mass index (BMI) 35.0-35.9, adult: Secondary | ICD-10-CM | POA: Diagnosis not present

## 2018-03-09 ENCOUNTER — Other Ambulatory Visit: Payer: Self-pay | Admitting: Internal Medicine

## 2018-04-22 NOTE — Progress Notes (Signed)
Subjective:    Patient ID: Lisa [name], female    DOB: 1955-06-28, 63 y.o.   MRN: [id number]  HPI She is here for a physical exam.   She is not sleeping well.  Some of her poor sleep is related to left hip pain - she can not lay on it.  She has been sleeping in a recliner.  She has seen ortho.  She has OA but does not need a replacement at this time.  They did not offer any other reason for the pain.  She wonders if part of it is related to her muscular dystrophy.  She is taking pain medication now, which helps, but she is still not sleeping well..    Since she was here last year she does feel more unsteady.  She is using a walking now consistently.  Urinary urgency: She does have urinary urgency and is unsure if this was related to her muscular dystrophy or something else.  She denies any dysuria or hematuria.  She has not discussed the urinary urgency with her neurologist.  Medications and allergies reviewed with patient and updated if appropriate.  Patient Active Problem List   Diagnosis Date Noted  . Carotid artery disease ([place]) 10/27/2017  . Subclavian steal syndrome 10/27/2017  . Jaw pain 10/27/2017  . Elevated LFTs 07/18/2017  . Fascioscapulohumeral muscular dystrophy ([place]) 04/21/2017  . Prediabetes 04/28/2016  . Hepatitis C antibody test positive - false positive 04/28/2016  . Left hip pain 04/23/2016  . Leg heaviness 04/23/2016  . Right arm pain 04/23/2016  . Arthralgia 04/23/2016  . TMJ arthralgia 05/22/2015  . Hyperlipidemia 12/19/2014  . Essential hypertension 12/19/2014  . History of colonic polyps 12/19/2014  . Left lumbar radiculopathy 05/27/2009  . Left foot drop 05/27/2009    Current Outpatient Medications on File Prior to Visit  Medication Sig Dispense Refill  . aspirin (ASPIRIN EC) 81 MG EC tablet Take 81 mg by mouth daily. Swallow whole.    . calcium carbonate (TUMS - DOSED IN MG ELEMENTAL CALCIUM) 500 MG chewable tablet Chew 1 tablet by mouth as  needed for indigestion or heartburn.    . celecoxib (CELEBREX) 200 MG capsule Take 200 mg by mouth 2 (two) times daily.     . Magnesium 250 MG TABS Take 400 mg by mouth daily.     . Multiple Vitamin (MULTI VITAMIN DAILY PO) Take by mouth.    . Omega-3 Fatty Acids (FISH OIL) 1200 MG CPDR Take 1,200 mg by mouth daily.    . SOOLANTRA 1 % CREA APP AA ON THE FACE QD  3  . traMADol (ULTRAM) 50 MG tablet Take 50 mg by mouth every 6 (six) hours as needed.    . Vitamin D, Cholecalciferol, 1000 UNITS TABS Take 2,000 Units by mouth daily.     . [DISCONTINUED] losartan-hydrochlorothiazide (HYZAAR) 50-12.5 MG tablet Take 1 tablet by mouth daily. 90 tablet 3   No current facility-administered medications on file prior to visit.     Past Medical History:  Diagnosis Date  . Cataract    removed from both eyes  . GERD (gastroesophageal reflux disease)    no issue while on tums  . Heart murmur   . Heavy menses    irregular; on Provera  . Hyperlipidemia    diet controlled  . Hypertension   . Muscular dystrophy, facioscapulohumeral [facility])     Past Surgical History:  Procedure Laterality Date  . CATARACT EXTRACTION Bilateral   .  CESAREAN SECTION     X2  . COLONOSCOPY  2008   [facility]  . LASER ABLATION CONDYLOMA CERVICAL / VULVAR    . LASIK Bilateral   . LUMBAR DISC SURGERY  2011   L4-5 ; Dr Shellia Carwin  . POLYPECTOMY    . VITRECTOMY Left 01-2015    Social History   Socioeconomic History  . Marital status: Married    Spouse name: Not on file  . Number of children: 2  . Years of education: 19  . Highest education level: Not on file  Occupational History  . Occupation: retired Print production planner  Social Needs  . Financial resource strain: Not on file  . Food insecurity:    Worry: Not on file    Inability: Not on file  . Transportation needs:    Medical: Not on file    Non-medical: Not on file  Tobacco Use  . Smoking status: Never Smoker  . Smokeless tobacco: Never Used  Substance  and Sexual Activity  . Alcohol use: No    Alcohol/week: 0.0 standard drinks  . Drug use: No  . Sexual activity: Not on file  Lifestyle  . Physical activity:    Days per week: Not on file    Minutes per session: Not on file  . Stress: Not on file  Relationships  . Social connections:    Talks on phone: Not on file    Gets together: Not on file    Attends religious service: Not on file    Active member of club or organization: Not on file    Attends meetings of clubs or organizations: Not on file    Relationship status: Not on file  Other Topics Concern  . Not on file  Social History Narrative   Lives with husband in a 2 story home.  Has 2 children and 3 grandchildren.  Retired Print production planner.  Education: college degree.     Family History  Problem Relation Age of Onset  . Thyroid cancer Mother   . Hypertension Mother   . Heart murmur Mother 50       unknown valve replaced  . Colon polyps Father   . Hyperlipidemia Father   . Atrial fibrillation Father        has PPM  . CAD Father 66       stent placed  . Diabetes Paternal Uncle   . Diabetes Paternal Grandmother   . Heart attack Paternal Grandmother        <65  . Stroke Paternal Grandfather   . Hypertension Sister   . Hyperlipidemia Sister   . Muscular dystrophy Cousin   . Colon cancer Neg Hx   . Esophageal cancer Neg Hx   . Rectal cancer Neg Hx   . Stomach cancer Neg Hx     Review of Systems  Constitutional: Negative for chills and fever.  Eyes: Positive for visual disturbance (from cataract surgery).  Respiratory: Negative for cough, shortness of breath and wheezing.   Cardiovascular: Positive for leg swelling (left from foot drop). Negative for chest pain and palpitations.  Gastrointestinal: Positive for constipation (intermittent - controlled with mg). Negative for abdominal pain, blood in stool, diarrhea and nausea.       Occ gerd  Genitourinary: Positive for urgency. Negative for dysuria, frequency and  hematuria.  Musculoskeletal: Positive for arthralgias (b/l shoulders and neck  - from neck OA, left hip, b/l thumbs).  Skin: Negative for color change and rash.  Neurological: Positive for  dizziness and numbness (right hand  - from neck). Negative for headaches.  Psychiatric/Behavioral: Negative for dysphoric mood. The patient is not nervous/anxious.        Objective:   Vitals:   04/24/18 1053  BP: (!) 152/90  Pulse: 75  Resp: 16  Temp: 99 F (37.2 C)  SpO2: 95%   Filed Weights   04/24/18 1053  Weight: 224 lb (101.6 kg)   Body mass index is 35.08 kg/m.  BP Readings from Last 3 Encounters:  04/24/18 (!) 152/90  11/02/17 116/76  10/27/17 132/70    Wt Readings from Last 3 Encounters:  04/24/18 224 lb (101.6 kg)  11/30/17 222 lb (100.7 kg)  11/02/17 222 lb (100.7 kg)     Physical Exam Constitutional: She appears well-developed and well-nourished. No distress.  HENT:  Head: Normocephalic and atraumatic.  Right Ear: External ear normal. Normal ear canal and TM Left Ear: External ear normal.  Normal ear canal and TM Mouth/Throat: Oropharynx is clear and moist.  Eyes: Conjunctivae and EOM are normal.  Neck: Neck supple. No tracheal deviation present. No thyromegaly present.  No carotid bruit  Cardiovascular: Normal rate, regular rhythm and normal heart sounds.   No murmur heard.  No edema. Pulmonary/Chest: Effort normal and breath sounds normal. No respiratory distress. She has no wheezes. She has no rales.  Breast: deferred to Gyn Abdominal: Soft. She exhibits no distension. There is no tenderness.  Lymphadenopathy: She has no cervical adenopathy.  Skin: Skin is warm and dry. She is not diaphoretic.  Psychiatric: She has a normal mood and affect. Her behavior is normal.        Assessment & Plan:   Physical exam: Screening blood work   ordered Immunizations  Flu vaccine due,  Discussed shingrix Colonoscopy Mammogram   Up to date  Gyn   Up to date  Dexa Eye  exams     Up to date  EKG   10/2017 Exercise Weight   Skin  No concerns Substance abuse  none  See Problem List for Assessment and Plan of chronic medical problems.

## 2018-04-22 NOTE — Patient Instructions (Addendum)
Tests ordered today. Your results will be released to [facility] (or called to you) after review, usually within 72hours after test completion. If any changes need to be made, you will be notified at that same time.  All other Health Maintenance issues reviewed.   All recommended immunizations and age-appropriate screenings are up-to-date or discussed.  No immunizations administered today.   Medications reviewed and updated.  Changes include :   Stop crestor for one month.  In one month start pravastatin 10 mg three times a week.  If you still have muscle pain with this - stop it and let me know - we will then try zetia (not a statin).  Your prescription(s) have been submitted to your pharmacy. Please take as directed and contact our office if you believe you are having problem(s) with the medication(s).  Please followup in 6 months   Health Maintenance, Female Adopting a healthy lifestyle and getting preventive care can go a long way to promote health and wellness. Talk with your health care provider about what schedule of regular examinations is right for you. This is a good chance for you to check in with your provider about disease prevention and staying healthy. In between checkups, there are plenty of things you can do on your own. Experts have done a lot of research about which lifestyle changes and preventive measures are most likely to keep you healthy. Ask your health care provider for more information. Weight and diet Eat a healthy diet  Be sure to include plenty of vegetables, fruits, low-fat dairy products, and lean protein.  Do not eat a lot of foods high in solid fats, added sugars, or salt.  Get regular exercise. This is one of the most important things you can do for your health. ? Most adults should exercise for at least 150 minutes each week. The exercise should increase your heart rate and make you sweat (moderate-intensity exercise). ? Most adults should also do  strengthening exercises at least twice a week. This is in addition to the moderate-intensity exercise. Maintain a healthy weight  Body mass index (BMI) is a measurement that can be used to identify possible weight problems. It estimates body fat based on height and weight. Your health care provider can help determine your BMI and help you achieve or maintain a healthy weight.  For females 6 years of age and older: ? A BMI below 18.5 is considered underweight. ? A BMI of 18.5 to 24.9 is normal. ? A BMI of 25 to 29.9 is considered overweight. ? A BMI of 30 and above is considered obese. Watch levels of cholesterol and blood lipids  You should start having your blood tested for lipids and cholesterol at 64 years of age, then have this test every 5 years.  You may need to have your cholesterol levels checked more often if: ? Your lipid or cholesterol levels are high. ? You are older than 64 years of age. ? You are at high risk for heart disease. Cancer screening Lung Cancer  Lung cancer screening is recommended for adults 43-86 years old who are at high risk for lung cancer because of a history of smoking.  A yearly low-dose CT scan of the lungs is recommended for people who: ? Currently smoke. ? Have quit within the past 15 years. ? Have at least a 30-pack-year history of smoking. A pack year is smoking an average of one pack of cigarettes a day for 1 year.  Yearly screening should  continue until it has been 15 years since you quit.  Yearly screening should stop if you develop a health problem that would prevent you from having lung cancer treatment. Breast Cancer  Practice breast self-awareness. This means understanding how your breasts normally appear and feel.  It also means doing regular breast self-exams. Let your health care provider know about any changes, no matter how small.  If you are in your 20s or 30s, you should have a clinical breast exam (CBE) by a health care  provider every 1-3 years as part of a regular health exam.  If you are 81 or older, have a CBE every year. Also consider having a breast X-ray (mammogram) every year.  If you have a family history of breast cancer, talk to your health care provider about genetic screening.  If you are at high risk for breast cancer, talk to your health care provider about having an MRI and a mammogram every year.  Breast cancer gene (BRCA) assessment is recommended for women who have family members with BRCA-related cancers. BRCA-related cancers include: ? Breast. ? Ovarian. ? Tubal. ? Peritoneal cancers.  Results of the assessment will determine the need for genetic counseling and BRCA1 and BRCA2 testing. Cervical Cancer Your health care provider may recommend that you be screened regularly for cancer of the pelvic organs (ovaries, uterus, and vagina). This screening involves a pelvic examination, including checking for microscopic changes to the surface of your cervix (Pap test). You may be encouraged to have this screening done every 3 years, beginning at age 80.  For women ages 39-65, health care providers may recommend pelvic exams and Pap testing every 3 years, or they may recommend the Pap and pelvic exam, combined with testing for human papilloma virus (HPV), every 5 years. Some types of HPV increase your risk of cervical cancer. Testing for HPV may also be done on women of any age with unclear Pap test results.  Other health care providers may not recommend any screening for nonpregnant women who are considered low risk for pelvic cancer and who do not have symptoms. Ask your health care provider if a screening pelvic exam is right for you.  If you have had past treatment for cervical cancer or a condition that could lead to cancer, you need Pap tests and screening for cancer for at least 20 years after your treatment. If Pap tests have been discontinued, your risk factors (such as having a new sexual  partner) need to be reassessed to determine if screening should resume. Some women have medical problems that increase the chance of getting cervical cancer. In these cases, your health care provider may recommend more frequent screening and Pap tests. Colorectal Cancer  This type of cancer can be detected and often prevented.  Routine colorectal cancer screening usually begins at 64 years of age and continues through 64 years of age.  Your health care provider may recommend screening at an earlier age if you have risk factors for colon cancer.  Your health care provider may also recommend using home test kits to check for hidden blood in the stool.  A small camera at the end of a tube can be used to examine your colon directly (sigmoidoscopy or colonoscopy). This is done to check for the earliest forms of colorectal cancer.  Routine screening usually begins at age 35.  Direct examination of the colon should be repeated every 5-10 years through 64 years of age. However, you may need to be  screened more often if early forms of precancerous polyps or small growths are found. Skin Cancer  Check your skin from head to toe regularly.  Tell your health care provider about any new moles or changes in moles, especially if there is a change in a mole's shape or color.  Also tell your health care provider if you have a mole that is larger than the size of a pencil eraser.  Always use sunscreen. Apply sunscreen liberally and repeatedly throughout the day.  Protect yourself by wearing long sleeves, pants, a wide-brimmed hat, and sunglasses whenever you are outside. Heart disease, diabetes, and high blood pressure  High blood pressure causes heart disease and increases the risk of stroke. High blood pressure is more likely to develop in: ? People who have blood pressure in the high end of the normal range (130-139/85-89 mm Hg). ? People who are overweight or obese. ? People who are African  American.  If you are 59-50 years of age, have your blood pressure checked every 3-5 years. If you are 19 years of age or older, have your blood pressure checked every year. You should have your blood pressure measured twice-once when you are at a hospital or clinic, and once when you are not at a hospital or clinic. Record the average of the two measurements. To check your blood pressure when you are not at a hospital or clinic, you can use: ? An automated blood pressure machine at a pharmacy. ? A home blood pressure monitor.  If you are between 34 years and 76 years old, ask your health care provider if you should take aspirin to prevent strokes.  Have regular diabetes screenings. This involves taking a blood sample to check your fasting blood sugar level. ? If you are at a normal weight and have a low risk for diabetes, have this test once every three years after 64 years of age. ? If you are overweight and have a high risk for diabetes, consider being tested at a younger age or more often. Preventing infection Hepatitis B  If you have a higher risk for hepatitis B, you should be screened for this virus. You are considered at high risk for hepatitis B if: ? You were born in a country where hepatitis B is common. Ask your health care provider which countries are considered high risk. ? Your parents were born in a high-risk country, and you have not been immunized against hepatitis B (hepatitis B vaccine). ? You have HIV or AIDS. ? You use needles to inject street drugs. ? You live with someone who has hepatitis B. ? You have had sex with someone who has hepatitis B. ? You get hemodialysis treatment. ? You take certain medicines for conditions, including cancer, organ transplantation, and autoimmune conditions. Hepatitis C  Blood testing is recommended for: ? Everyone born from [birth year] through 1966. ? Anyone with known risk factors for hepatitis C. Sexually transmitted infections  (STIs)  You should be screened for sexually transmitted infections (STIs) including gonorrhea and chlamydia if: ? You are sexually active and are younger than 64 years of age. ? You are older than 64 years of age and your health care provider tells you that you are at risk for this type of infection. ? Your sexual activity has changed since you were last screened and you are at an increased risk for chlamydia or gonorrhea. Ask your health care provider if you are at risk.  If you do not have  HIV, but are at risk, it may be recommended that you take a prescription medicine daily to prevent HIV infection. This is called pre-exposure prophylaxis (PrEP). You are considered at risk if: ? You are sexually active and do not regularly use condoms or know the HIV status of your partner(s). ? You take drugs by injection. ? You are sexually active with a partner who has HIV. Talk with your health care provider about whether you are at high risk of being infected with HIV. If you choose to begin PrEP, you should first be tested for HIV. You should then be tested every 3 months for as long as you are taking PrEP. Pregnancy  If you are premenopausal and you may become pregnant, ask your health care provider about preconception counseling.  If you may become pregnant, take 400 to 800 micrograms (mcg) of folic acid every day.  If you want to prevent pregnancy, talk to your health care provider about birth control (contraception). Osteoporosis and menopause  Osteoporosis is a disease in which the bones lose minerals and strength with aging. This can result in serious bone fractures. Your risk for osteoporosis can be identified using a bone density scan.  If you are 55 years of age or older, or if you are at risk for osteoporosis and fractures, ask your health care provider if you should be screened.  Ask your health care provider whether you should take a calcium or vitamin D supplement to lower your risk  for osteoporosis.  Menopause may have certain physical symptoms and risks.  Hormone replacement therapy may reduce some of these symptoms and risks. Talk to your health care provider about whether hormone replacement therapy is right for you. Follow these instructions at home:  Schedule regular health, dental, and eye exams.  Stay current with your immunizations.  Do not use any tobacco products including cigarettes, chewing tobacco, or electronic cigarettes.  If you are pregnant, do not drink alcohol.  If you are breastfeeding, limit how much and how often you drink alcohol.  Limit alcohol intake to no more than 1 drink per day for nonpregnant women. One drink equals 12 ounces of beer, 5 ounces of wine, or 1 ounces of hard liquor.  Do not use street drugs.  Do not share needles.  Ask your health care provider for help if you need support or information about quitting drugs.  Tell your health care provider if you often feel depressed.  Tell your health care provider if you have ever been abused or do not feel safe at home. This information is not intended to replace advice given to you by your health care provider. Make sure you discuss any questions you have with your health care provider. Document Released: 09/21/2010 Document Revised: 08/14/2015 Document Reviewed: 12/10/2014 Elsevier Interactive Patient Education  2019 [employer].

## 2018-04-24 ENCOUNTER — Other Ambulatory Visit (INDEPENDENT_AMBULATORY_CARE_PROVIDER_SITE_OTHER): Payer: 59

## 2018-04-24 ENCOUNTER — Encounter: Payer: Self-pay | Admitting: Internal Medicine

## 2018-04-24 ENCOUNTER — Ambulatory Visit (INDEPENDENT_AMBULATORY_CARE_PROVIDER_SITE_OTHER): Payer: 59 | Admitting: Internal Medicine

## 2018-04-24 VITALS — BP 152/90 | HR 75 | Temp 99.0°F | Resp 16 | Ht 67.0 in | Wt 224.0 lb

## 2018-04-24 DIAGNOSIS — E7849 Other hyperlipidemia: Secondary | ICD-10-CM

## 2018-04-24 DIAGNOSIS — R3915 Urgency of urination: Secondary | ICD-10-CM

## 2018-04-24 DIAGNOSIS — M25552 Pain in left hip: Secondary | ICD-10-CM

## 2018-04-24 DIAGNOSIS — I10 Essential (primary) hypertension: Secondary | ICD-10-CM | POA: Diagnosis not present

## 2018-04-24 DIAGNOSIS — R7303 Prediabetes: Secondary | ICD-10-CM

## 2018-04-24 DIAGNOSIS — Z0000 Encounter for general adult medical examination without abnormal findings: Secondary | ICD-10-CM

## 2018-04-24 DIAGNOSIS — G7102 Facioscapulohumeral muscular dystrophy: Secondary | ICD-10-CM

## 2018-04-24 LAB — CBC WITH DIFFERENTIAL/PLATELET
BASOS ABS: 0.1 10*3/uL (ref 0.0–0.1)
BASOS PCT: 0.7 % (ref 0.0–3.0)
EOS ABS: 0.2 10*3/uL (ref 0.0–0.7)
Eosinophils Relative: 2.1 % (ref 0.0–5.0)
HCT: 43.7 % (ref 36.0–46.0)
HEMOGLOBIN: 14.7 g/dL (ref 12.0–15.0)
Lymphocytes Relative: 28.4 % (ref 12.0–46.0)
Lymphs Abs: 2.5 10*3/uL (ref 0.7–4.0)
MCHC: 33.7 g/dL (ref 30.0–36.0)
MCV: 89.8 fl (ref 78.0–100.0)
MONO ABS: 0.7 10*3/uL (ref 0.1–1.0)
Monocytes Relative: 7.7 % (ref 3.0–12.0)
Neutro Abs: 5.4 10*3/uL (ref 1.4–7.7)
Neutrophils Relative %: 61.1 % (ref 43.0–77.0)
PLATELETS: 273 10*3/uL (ref 150.0–400.0)
RBC: 4.86 Mil/uL (ref 3.87–5.11)
RDW: 13.5 % (ref 11.5–15.5)
WBC: 8.9 10*3/uL (ref 4.0–10.5)

## 2018-04-24 LAB — LIPID PANEL
CHOL/HDL RATIO: 6
CHOLESTEROL: 219 mg/dL — AB (ref 0–200)
HDL: 38.6 mg/dL — ABNORMAL LOW (ref >39.00)
NonHDL: 180.72
TRIGLYCERIDES: 234 mg/dL — AB (ref 0.0–149.0)
VLDL: 46.8 mg/dL — ABNORMAL HIGH (ref 0.0–40.0)

## 2018-04-24 LAB — COMPREHENSIVE METABOLIC PANEL
ALBUMIN: 4.4 g/dL (ref 3.5–5.2)
ALT: 43 U/L — ABNORMAL HIGH (ref 0–35)
AST: 24 U/L (ref 0–37)
Alkaline Phosphatase: 95 U/L (ref 39–117)
BILIRUBIN TOTAL: 0.5 mg/dL (ref 0.2–1.2)
BUN: 17 mg/dL (ref 6–23)
CALCIUM: 10.3 mg/dL (ref 8.4–10.5)
CO2: 29 mEq/L (ref 19–32)
CREATININE: 0.64 mg/dL (ref 0.40–1.20)
Chloride: 101 mEq/L (ref 96–112)
GFR: 93.69 mL/min (ref >60.00)
Glucose, Bld: 103 mg/dL — ABNORMAL HIGH (ref 70–99)
Potassium: 4.3 mEq/L (ref 3.5–5.1)
Sodium: 139 mEq/L (ref 135–145)
Total Protein: 7.1 g/dL (ref 6.0–8.3)

## 2018-04-24 LAB — TSH: TSH: 1.87 u[IU]/mL (ref 0.35–4.50)

## 2018-04-24 LAB — HEMOGLOBIN A1C: Hgb A1c MFr Bld: 5.6 % (ref 4.6–6.5)

## 2018-04-24 LAB — LDL CHOLESTEROL, DIRECT: Direct LDL: 139 mg/dL

## 2018-04-24 MED ORDER — LOSARTAN POTASSIUM 100 MG PO TABS: 100.0000 mg | ORAL_TABLET | Freq: Every day | ORAL | 3 refills | Status: DC

## 2018-04-24 MED ORDER — PRAVASTATIN SODIUM 10 MG PO TABS: 10.0000 mg | ORAL_TABLET | ORAL | 5 refills | Status: DC

## 2018-04-24 MED ORDER — GABAPENTIN 100 MG PO CAPS: ORAL_CAPSULE | ORAL | 3 refills | Status: DC

## 2018-04-24 MED ORDER — HYDROCHLOROTHIAZIDE 25 MG PO TABS: ORAL_TABLET | ORAL | 3 refills | Status: DC

## 2018-04-24 NOTE — Assessment & Plan Note (Signed)
Has urinary urgency No other concerning symptoms for infection, but will check UA, urine culture to rule out infection ?  Related to her muscular dystrophy-she will discuss with neurology Can refer to urology for further testing if needed

## 2018-04-24 NOTE — Assessment & Plan Note (Signed)
Blood pressure elevated here today, but typically well controlled She will continue to monitor Continue current medications at current doses CMP, TSH

## 2018-04-24 NOTE — Assessment & Plan Note (Signed)
Has seen orthopedics-has arthritis, but does not need replacement ?  Some of her pain coming from muscular dystrophy Taking tramadol and Celebrex in addition to Tylenol as needed ?  Other cause for her pain or anything else that may help Will refer to sports medicine for further evaluation

## 2018-04-24 NOTE — Assessment & Plan Note (Signed)
Following with neurology at [facility] Using a walker to ambulate

## 2018-04-24 NOTE — Assessment & Plan Note (Signed)
Check A1c Low sugar/carbohydrate diet Limited with exercise

## 2018-04-24 NOTE — Assessment & Plan Note (Addendum)
Ck lipids, cmp, tsh Crestor is calling muscle aches, did not tolerate Lipitor in the past Stop crestor for one month.  In one month start pravastatin 10 mg three times a week.  If you still have muscle pain with this - stop it and let me know - we will then try zetia (not a statin).

## 2018-04-25 LAB — URINALYSIS, ROUTINE W REFLEX MICROSCOPIC
Bilirubin Urine: NEGATIVE
HGB URINE DIPSTICK: NEGATIVE
Ketones, ur: NEGATIVE
Nitrite: NEGATIVE
PH: 7 (ref 5.0–8.0)
RBC / HPF: NONE SEEN (ref 0–?)
SPECIFIC GRAVITY, URINE: 1.02 (ref 1.000–1.030)
TOTAL PROTEIN, URINE-UPE24: NEGATIVE
Urine Glucose: NEGATIVE
Urobilinogen, UA: 0.2 (ref 0.0–1.0)

## 2018-04-25 LAB — URINE CULTURE
MICRO NUMBER:: 141389
RESULT: NO GROWTH
SPECIMEN QUALITY:: ADEQUATE

## 2018-04-26 ENCOUNTER — Encounter: Payer: Self-pay | Admitting: Internal Medicine

## 2018-05-11 ENCOUNTER — Encounter: Payer: Self-pay | Admitting: Gastroenterology

## 2018-05-12 ENCOUNTER — Other Ambulatory Visit: Payer: Self-pay | Admitting: Internal Medicine

## 2018-05-13 NOTE — Progress Notes (Signed)
[facility] [place],  [address] Phone: [phone] Subjective:   Fontaine No, am serving as a scribe for Dr. Hulan Saas.  I'm seeing this patient by the request  of:  Binnie Rail, MD   CC: Left hip pain  [id number]  RIVKAH [name] is a 63 y.o. female coming in with complaint of left hip pain. Pain in groin. Has had injections in that area and in the back. Only provided 1 week of relief. Patient has muscular dystrophy. Pain is present with lumbar flexion and sitting for prolonged periods. Pain is sharp and dull intermittently. Tramadol dulls her pain. Pain is alleviated lying supine in a reclined position.  Patient states that usually sitting seems to do relatively well as well.  Unfortunately leg seems to fatigue.  Past medical history is significant for back surgery and they did leave a residual foot drop on the left side.  Has gone through physical therapy as well as multiple different types of epidural injections in the back with minimal response.  Patient is also had an intra-articular hip injection with no significant benefit.   Past medical history significant for a muscular dystrophy specifically fascial scapulohumeral   Previous imaging shows that patient did have MRI in 2018 the lumbar spine.  Independently visualized by me showing severe L4-5 facet arthropathy as well as left foraminal stenosis at L5-S1 status post decompression with progressive fatty infiltration of the back muscles in the quadratus lumborum  Past Medical History:  Diagnosis Date  . Cataract    removed from both eyes  . GERD (gastroesophageal reflux disease)    no issue while on tums  . Heart murmur   . Heavy menses    irregular; on Provera  . Hyperlipidemia    diet controlled  . Hypertension   . Muscular dystrophy, facioscapulohumeral [facility])    Past Surgical History:  Procedure Laterality Date  . CATARACT EXTRACTION Bilateral   .  CESAREAN SECTION     X2  . COLONOSCOPY  2008   [facility]  . LASER ABLATION CONDYLOMA CERVICAL / VULVAR    . LASIK Bilateral   . LUMBAR DISC SURGERY  2011   L4-5 ; Dr Shellia Carwin  . POLYPECTOMY    . VITRECTOMY Left 01-2015   Social History   Socioeconomic History  . Marital status: Married    Spouse name: Not on file  . Number of children: 2  . Years of education: 6  . Highest education level: Not on file  Occupational History  . Occupation: retired Print production planner  Social Needs  . Financial resource strain: Not on file  . Food insecurity:    Worry: Not on file    Inability: Not on file  . Transportation needs:    Medical: Not on file    Non-medical: Not on file  Tobacco Use  . Smoking status: Never Smoker  . Smokeless tobacco: Never Used  Substance and Sexual Activity  . Alcohol use: No    Alcohol/week: 0.0 standard drinks  . Drug use: No  . Sexual activity: Not on file  Lifestyle  . Physical activity:    Days per week: Not on file    Minutes per session: Not on file  . Stress: Not on file  Relationships  . Social connections:    Talks on phone: Not on file    Gets together: Not on file    Attends religious service: Not on file  Active member of club or organization: Not on file    Attends meetings of clubs or organizations: Not on file    Relationship status: Not on file  Other Topics Concern  . Not on file  Social History Narrative   Lives with husband in a 2 story home.  Has 2 children and 3 grandchildren.  Retired Print production planner.  Education: college degree.    Allergies  Allergen Reactions  . Atorvastatin     Muscle pain  . Crestor [Rosuvastatin Calcium]   . Lisinopril Cough   Family History  Problem Relation Age of Onset  . Thyroid cancer Mother   . Hypertension Mother   . Heart murmur Mother 81       unknown valve replaced  . Colon polyps Father   . Hyperlipidemia Father   . Atrial fibrillation Father        has PPM  . CAD Father 24        stent placed  . Diabetes Paternal Uncle   . Diabetes Paternal Grandmother   . Heart attack Paternal Grandmother        <65  . Stroke Paternal Grandfather   . Hypertension Sister   . Hyperlipidemia Sister   . Muscular dystrophy Cousin   . Colon cancer Neg Hx   . Esophageal cancer Neg Hx   . Rectal cancer Neg Hx   . Stomach cancer Neg Hx      Current Outpatient Medications (Cardiovascular):  .  hydrochlorothiazide (HYDRODIURIL) 25 MG tablet, TAKE 1 TABLET(25 MG) BY MOUTH DAILY .  losartan (COZAAR) 100 MG tablet, Take 1 tablet (100 mg total) by mouth daily. .  pravastatin (PRAVACHOL) 10 MG tablet, Take 1 tablet (10 mg total) by mouth 3 (three) times a week.   Current Outpatient Medications (Analgesics):  .  aspirin (ASPIRIN EC) 81 MG EC tablet, Take 81 mg by mouth daily. Swallow whole. .  celecoxib (CELEBREX) 200 MG capsule, Take 200 mg by mouth 2 (two) times daily.  .  traMADol (ULTRAM) 50 MG tablet, Take 50 mg by mouth every 6 (six) hours as needed.   Current Outpatient Medications (Other):  .  calcium carbonate (TUMS - DOSED IN MG ELEMENTAL CALCIUM) 500 MG chewable tablet, Chew 1 tablet by mouth as needed for indigestion or heartburn. .  gabapentin (NEURONTIN) 100 MG capsule, TAKE 1 CAPSULE(100 MG) BY MOUTH THREE TIMES DAILY .  Magnesium 250 MG TABS, Take 400 mg by mouth daily.  .  Multiple Vitamin (MULTI VITAMIN DAILY PO), Take by mouth. .  Omega-3 Fatty Acids (FISH OIL) 1200 MG CPDR, Take 1,200 mg by mouth daily. .  SOOLANTRA 1 % CREA, APP AA ON THE FACE QD .  Vitamin D, Cholecalciferol, 1000 UNITS TABS, Take 2,000 Units by mouth daily.  .  Vitamin D, Ergocalciferol, (DRISDOL) 1.25 MG (50000 UT) CAPS capsule, Take 1 capsule (50,000 Units total) by mouth every 7 (seven) days.    Past medical history, social, surgical and family history all reviewed in electronic medical record.  No pertanent information unless stated regarding to the chief complaint.   Review of  Systems:  No headache, visual changes, nausea, vomiting, diarrhea, constipation, dizziness, abdominal pain, skin rash, fevers, chills, night sweats, weight loss, swollen lymph nodes,  chest pain, shortness of breath, mood changes.  Positive muscle aches, body aches, weakness of the lower extremity  Objective  Blood pressure 122/80, pulse 92, height 5\' 7"  (1.702 m), weight 222 lb (100.7 kg), last menstrual period 09/30/2014,  SpO2 97 %.    General: No apparent distress alert and oriented x3 mood and affect normal, dressed appropriately.  HEENT: Pupils equal, extraocular movements intact  Respiratory: Patient's speak in full sentences and does not appear short of breath  Cardiovascular: No lower extremity edema, non tender, no erythema  Skin: Warm dry intact with no signs of infection or rash on extremities or on axial skeleton.  Abdomen: Soft nontender  Neuro: Cranial nerves II through XII are intact, neurovascularly intact in all extremities with  and 2+ pulses.  Lymph: No lymphadenopathy of posterior or anterior cervical chain or axillae bilaterally.  Gait antalgic walking with the aid of a walker.  Weakness noted in the left hip girdle MSK:  Non tender with full range of motion and good stability and symmetric strength and tone of shoulders, elbows, wrist,   Hip: Left   Shows patient does have decreased range of motion with internal and external range of motion of at least 10 degrees in both planes.  Forward flexion full passively but does have some discomfort with impingement on the anterior lateral aspect of the hip.  Patient does have significant foot drop noted patient has had what appears to be a negative straight leg test.  Achilles reflexes 1+ on the left   Impression and Recommendations:     This case required medical decision making of moderate complexity. The above documentation has been reviewed and is accurate and complete Lyndal Pulley, DO       Note: This dictation was  prepared with Dragon dictation along with smaller phrase technology. Any transcriptional errors that result from this process are unintentional.

## 2018-05-15 ENCOUNTER — Encounter: Payer: Self-pay | Admitting: Family Medicine

## 2018-05-15 ENCOUNTER — Ambulatory Visit: Payer: 59 | Admitting: Family Medicine

## 2018-05-15 DIAGNOSIS — M21372 Foot drop, left foot: Secondary | ICD-10-CM

## 2018-05-15 DIAGNOSIS — G7102 Facioscapulohumeral muscular dystrophy: Secondary | ICD-10-CM

## 2018-05-15 DIAGNOSIS — M25552 Pain in left hip: Secondary | ICD-10-CM | POA: Diagnosis not present

## 2018-05-15 MED ORDER — VITAMIN D (ERGOCALCIFEROL) 1.25 MG (50000 UNIT) PO CAPS: 50000.0000 [IU] | ORAL_CAPSULE | ORAL | 0 refills | Status: DC

## 2018-05-15 NOTE — Assessment & Plan Note (Signed)
I believe contributing significantly.  Discussed arginine supplementation, vitamin D supplementation as well.

## 2018-05-15 NOTE — Assessment & Plan Note (Signed)
Left hip pain.  I believe it is patient compensating for the left foot drop as well as patient's muscular dystrophy.  Believe that the weakness in the hip musculature causes patient to unfortunately although the joint.  Even though patient's previous x-ray showed only mild arthritic changes I do believe that is presenting more like arthritic pain. Discussed over-the-counter medications, gabapentin, discussed the differential pain lumbar radiculopathy.  Discussed the importance of weight loss and muscle strength in diet and nutrition.  We discussed the possibility of fixing patient's foot drop with a new AFO which patient declined.  Patient will trial the other conservative therapies and follow-up with me again in 4 weeks.

## 2018-05-15 NOTE — Assessment & Plan Note (Signed)
Patient does have a foot drop.  I believe the patient has seen try to use the musculature around the right hip to help compensate for this.  I discussed with patient about the possibility of an AFO that would help patient.  Patient declined at the moment.  We discussed other treatment options or could be beneficial.  Discussed taking the gabapentin on a regular basis.  Follow-up again 4 weeks

## 2018-05-15 NOTE — Patient Instructions (Addendum)
Good to see you  Heel lift but turn it around and put it in your toe We will consider the other bracing at a later date L-arginine over the counter any dose DHEA 50 mg daily  Once weekly vitamin D for 12 weeks to help muscle strength and endurance. This is a prescription Continue the gabapentin  Stay active Exercises 3 times a week.   See me again in 4 weeks and lets see how we are doing and it will be a process.

## 2018-05-17 DIAGNOSIS — I10 Essential (primary) hypertension: Secondary | ICD-10-CM | POA: Diagnosis not present

## 2018-05-17 DIAGNOSIS — M47816 Spondylosis without myelopathy or radiculopathy, lumbar region: Secondary | ICD-10-CM | POA: Diagnosis not present

## 2018-06-12 ENCOUNTER — Ambulatory Visit: Payer: 59 | Admitting: Family Medicine

## 2018-07-05 ENCOUNTER — Other Ambulatory Visit: Payer: Self-pay

## 2018-07-05 ENCOUNTER — Ambulatory Visit: Payer: 59 | Admitting: Family Medicine

## 2018-07-05 ENCOUNTER — Ambulatory Visit (INDEPENDENT_AMBULATORY_CARE_PROVIDER_SITE_OTHER)
Admission: RE | Admit: 2018-07-05 | Discharge: 2018-07-05 | Disposition: A | Discharge status: Home / Self Care | Payer: 59 | Source: Ambulatory Visit | Attending: Family Medicine | Admitting: Family Medicine

## 2018-07-05 ENCOUNTER — Encounter: Payer: Self-pay | Admitting: Family Medicine

## 2018-07-05 VITALS — BP 126/72 | HR 100 | Ht 67.0 in | Wt 227.0 lb

## 2018-07-05 DIAGNOSIS — M25562 Pain in left knee: Secondary | ICD-10-CM | POA: Diagnosis not present

## 2018-07-05 DIAGNOSIS — G7102 Facioscapulohumeral muscular dystrophy: Secondary | ICD-10-CM

## 2018-07-05 DIAGNOSIS — G8929 Other chronic pain: Secondary | ICD-10-CM

## 2018-07-05 DIAGNOSIS — M1712 Unilateral primary osteoarthritis, left knee: Secondary | ICD-10-CM | POA: Diagnosis not present

## 2018-07-05 DIAGNOSIS — M25561 Pain in right knee: Secondary | ICD-10-CM | POA: Diagnosis not present

## 2018-07-05 DIAGNOSIS — M21372 Foot drop, left foot: Secondary | ICD-10-CM | POA: Diagnosis not present

## 2018-07-05 NOTE — Assessment & Plan Note (Signed)
Likely from back surgery.  Could be also secondary to the muscular dystrophy.  Patient still does not want an AFO but I do think it is giving difficulty.

## 2018-07-05 NOTE — Patient Instructions (Signed)
Good to see you  Ice is your friend  Injected left knee Keep doing everything else you are doing  See me again in 4-6 weeks and if not improved consider gel injection in your knee

## 2018-07-05 NOTE — Assessment & Plan Note (Signed)
Patient does have more of a left knee degenerative changes.  Discussed icing regimen and home exercise.  Discussed which activities of doing which wants to avoid.  Patient is to increase activity slowly.  X-rays are pending.  I do believe patient would be a candidate for Visco supplementation.  Patient wants to avoid any surgical intervention.  Hold on any type of bracing at the moment.  I believe the foot drop would be more beneficial.

## 2018-07-05 NOTE — Assessment & Plan Note (Signed)
Discussed icing regimen and home exercise discussed avoiding certain activities.

## 2018-07-05 NOTE — Progress Notes (Signed)
[facility] [address] [place], [place] [address] Phone: [phone] Subjective:   Fontaine No, am serving as a scribe for Dr. Hulan Saas.     CC: hip pain and foot pain follow    BMW:[id number]   05/15/2018: Left hip pain.  I believe it is patient compensating for the left foot drop as well as patient's muscular dystrophy.  Believe that the weakness in the hip musculature causes patient to unfortunately although the joint.  Even though patient's previous x-ray showed only mild arthritic changes I do believe that is presenting more like arthritic pain. Discussed over-the-counter medications, gabapentin, discussed the differential pain lumbar radiculopathy.  Discussed the importance of weight loss and muscle strength in diet and nutrition.  We discussed the possibility of fixing patient's foot drop with a new AFO which patient declined.  Patient will trial the other conservative therapies and follow-up with me again in 4 weeks.  Update 07/05/2018: NUMA HEATWOLE is a 63 y.o. female coming in with complaint of foot and hip pain. Patient did put a heel lift in her shoe. Pain developed in left knee due to insert. Has not been wearing lift due to pain. Did try to do exercises for 2 weeks which aggravated her knee. Her hip pain is the same since last visit.  Patient has noticed maybe some improvement with the vitamin D.  Did not notice improvement with the other vitamin supplementations.     Past Medical History:  Diagnosis Date  . Cataract    removed from both eyes  . GERD (gastroesophageal reflux disease)    no issue while on tums  . Heart murmur   . Heavy menses    irregular; on Provera  . Hyperlipidemia    diet controlled  . Hypertension   . Muscular dystrophy, facioscapulohumeral [facility])    Past Surgical History:  Procedure Laterality Date  . CATARACT EXTRACTION Bilateral   . CESAREAN SECTION     X2  . COLONOSCOPY  2008   [facility]  .  LASER ABLATION CONDYLOMA CERVICAL / VULVAR    . LASIK Bilateral   . LUMBAR DISC SURGERY  2011   L4-5 ; Dr Shellia Carwin  . POLYPECTOMY    . VITRECTOMY Left 01-2015   Social History   Socioeconomic History  . Marital status: Married    Spouse name: Not on file  . Number of children: 2  . Years of education: 22  . Highest education level: Not on file  Occupational History  . Occupation: retired Print production planner  Social Needs  . Financial resource strain: Not on file  . Food insecurity:    Worry: Not on file    Inability: Not on file  . Transportation needs:    Medical: Not on file    Non-medical: Not on file  Tobacco Use  . Smoking status: Never Smoker  . Smokeless tobacco: Never Used  Substance and Sexual Activity  . Alcohol use: No    Alcohol/week: 0.0 standard drinks  . Drug use: No  . Sexual activity: Not on file  Lifestyle  . Physical activity:    Days per week: Not on file    Minutes per session: Not on file  . Stress: Not on file  Relationships  . Social connections:    Talks on phone: Not on file    Gets together: Not on file    Attends religious service: Not on file    Active member of  club or organization: Not on file    Attends meetings of clubs or organizations: Not on file    Relationship status: Not on file  Other Topics Concern  . Not on file  Social History Narrative   Lives with husband in a 2 story home.  Has 2 children and 3 grandchildren.  Retired Print production planner.  Education: college degree.    Allergies  Allergen Reactions  . Atorvastatin     Muscle pain  . Crestor [Rosuvastatin Calcium]   . Lisinopril Cough   Family History  Problem Relation Age of Onset  . Thyroid cancer Mother   . Hypertension Mother   . Heart murmur Mother 56       unknown valve replaced  . Colon polyps Father   . Hyperlipidemia Father   . Atrial fibrillation Father        has PPM  . CAD Father 28       stent placed  . Diabetes Paternal Uncle   . Diabetes  Paternal Grandmother   . Heart attack Paternal Grandmother        <65  . Stroke Paternal Grandfather   . Hypertension Sister   . Hyperlipidemia Sister   . Muscular dystrophy Cousin   . Colon cancer Neg Hx   . Esophageal cancer Neg Hx   . Rectal cancer Neg Hx   . Stomach cancer Neg Hx      Current Outpatient Medications (Cardiovascular):  .  hydrochlorothiazide (HYDRODIURIL) 25 MG tablet, TAKE 1 TABLET(25 MG) BY MOUTH DAILY .  losartan (COZAAR) 100 MG tablet, Take 1 tablet (100 mg total) by mouth daily. .  pravastatin (PRAVACHOL) 10 MG tablet, Take 1 tablet (10 mg total) by mouth 3 (three) times a week.   Current Outpatient Medications (Analgesics):  .  aspirin (ASPIRIN EC) 81 MG EC tablet, Take 81 mg by mouth daily. Swallow whole. .  celecoxib (CELEBREX) 200 MG capsule, Take 200 mg by mouth 2 (two) times daily.  .  traMADol (ULTRAM) 50 MG tablet, Take 50 mg by mouth every 6 (six) hours as needed.   Current Outpatient Medications (Other):  .  calcium carbonate (TUMS - DOSED IN MG ELEMENTAL CALCIUM) 500 MG chewable tablet, Chew 1 tablet by mouth as needed for indigestion or heartburn. .  gabapentin (NEURONTIN) 100 MG capsule, TAKE 1 CAPSULE(100 MG) BY MOUTH THREE TIMES DAILY .  gabapentin (NEURONTIN) 300 MG capsule, Take 300 mg by mouth 2 (two) times daily. .  Magnesium 250 MG TABS, Take 400 mg by mouth daily.  .  Multiple Vitamin (MULTI VITAMIN DAILY PO), Take by mouth. .  Omega-3 Fatty Acids (FISH OIL) 1200 MG CPDR, Take 1,200 mg by mouth daily. .  SOOLANTRA 1 % CREA, APP AA ON THE FACE QD .  Vitamin D, Cholecalciferol, 1000 UNITS TABS, Take 2,000 Units by mouth daily.  .  Vitamin D, Ergocalciferol, (DRISDOL) 1.25 MG (50000 UT) CAPS capsule, Take 1 capsule (50,000 Units total) by mouth every 7 (seven) days.    Past medical history, social, surgical and family history all reviewed in electronic medical record.  No pertanent information unless stated regarding to the chief  complaint.   Review of Systems:  No headache, visual changes, nausea, vomiting, diarrhea, constipation, dizziness, abdominal pain, skin rash, fevers, chills, night sweats, weight loss, swollen lymph nodes,, chest pain, shortness of breath, mood changes.  Positive muscle aches, body aches  Objective  Blood pressure 126/72, pulse 100, height 5\' 7"  (1.702 m), weight 227  lb (103 kg), last menstrual period 09/30/2014, SpO2 97 %.    General: No apparent distress alert and oriented x3 mood and affect normal, dressed appropriately.  HEENT: Pupils equal, extraocular movements intact  Respiratory: Patient's speak in full sentences and does not appear short of breath  Cardiovascular: No lower extremity edema, non tender, no erythema  Skin: Warm dry intact with no signs of infection or rash on extremities or on axial skeleton.  Abdomen: Soft nontender  Neuro: Cranial nerves II through XII are intact, neurovascularly intact in all extremities with 2+ DTRs and 2+ pulses.  Lymph: No lymphadenopathy of posterior or anterior cervical chain or axillae bilaterally.  Gait n antalgic walking with the aid of a walker.  Foot drop noted on the left side MSK:  tender with full range of motion and good stability and symmetric strength and tone of shoulders, elbows, wrist and ankles bilaterally.  Knee: Bilateral valgus deformity noted. Large thigh to calf ratio.  Left greater than right Tender to palpation over medial and PF joint line.  ROM full in flexion and extension and lower leg rotation. instability with valgus force.  painful patellar compression. Patellar glide with moderate crepitus. Patellar and quadriceps tendons unremarkable. Hamstring and quadriceps strength is normal.  Left hip exam shows the patient does have tenderness over the greater trochanteric area.   After informed written and verbal consent, patient was seated on exam table. Left knee was prepped with alcohol swab and utilizing  anterolateral approach, patient's left knee space was injected with 4:1  marcaine 0.5%: Kenalog 40mg /dL. Patient tolerated the procedure well without immediate complications.   Impression and Recommendations:     This case required medical decision making of moderate complexity. The above documentation has been reviewed and is accurate and complete Lyndal Pulley, DO       Note: This dictation was prepared with Dragon dictation along with smaller phrase technology. Any transcriptional errors that result from this process are unintentional.

## 2018-07-17 DIAGNOSIS — M47816 Spondylosis without myelopathy or radiculopathy, lumbar region: Secondary | ICD-10-CM | POA: Diagnosis not present

## 2018-08-05 IMAGING — MR MR LUMBAR SPINE W/O CM
4 of 5 series · 26 of 48 positions shown · non-contrast
Comparison: 06/26/2009

CLINICAL DATA: Lower back pain radiating to the left leg.
Difficulty walking.

EXAM:
MRI LUMBAR SPINE WITHOUT CONTRAST
TECHNIQUE: Multiplanar, multisequence MR imaging of the lumbar spine was
performed. No intravenous contrast was administered.

[Series 4: T1 · sagittal · 4.0mm · 0.55mm/px · 6 of 13 slices shown (1 of 2)]
[im 1/13]
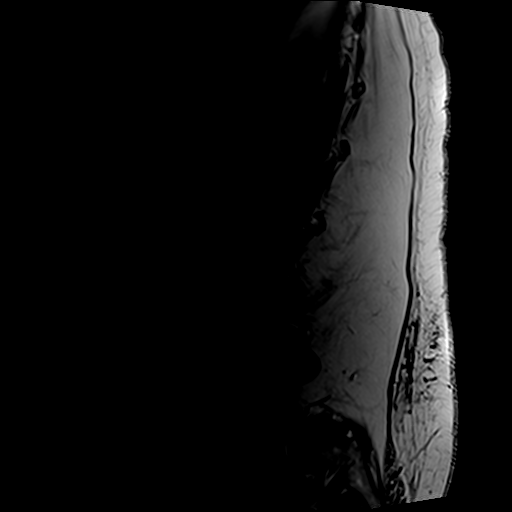
[im 3/13]
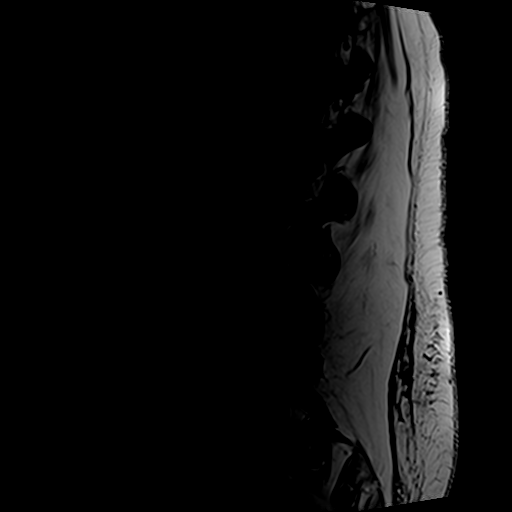
[im 5/13]
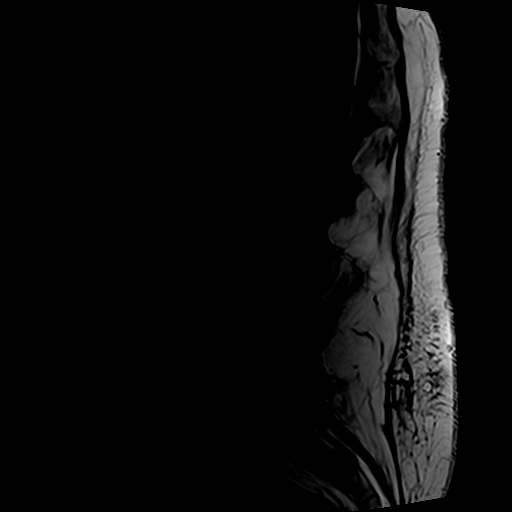
[im 8/13]
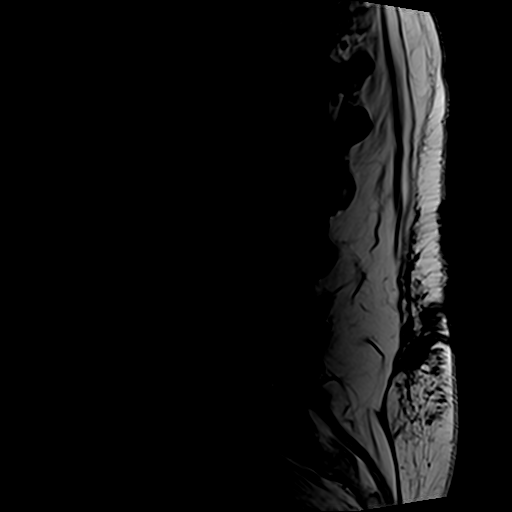
[im 10/13]
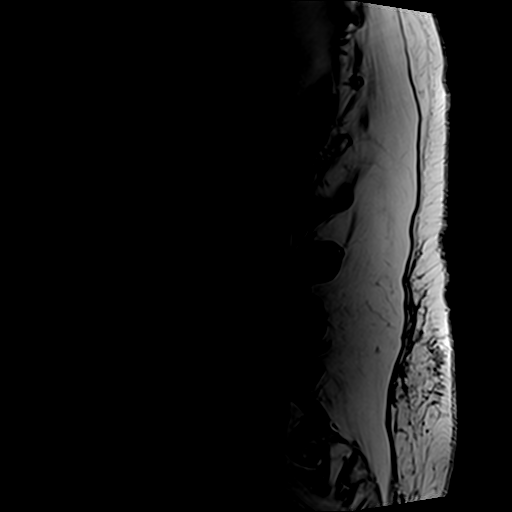
[im 13/13]
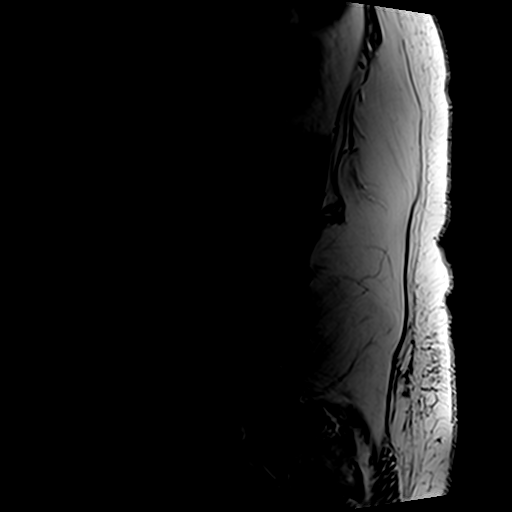

[Series 5: T2 post-contrast · sagittal · 4.0mm · 0.55mm/px · 6 of 13 slices shown]
[im 1/13]
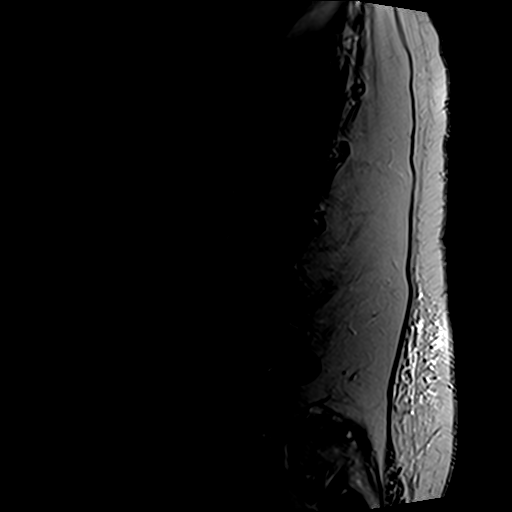
[im 3/13]
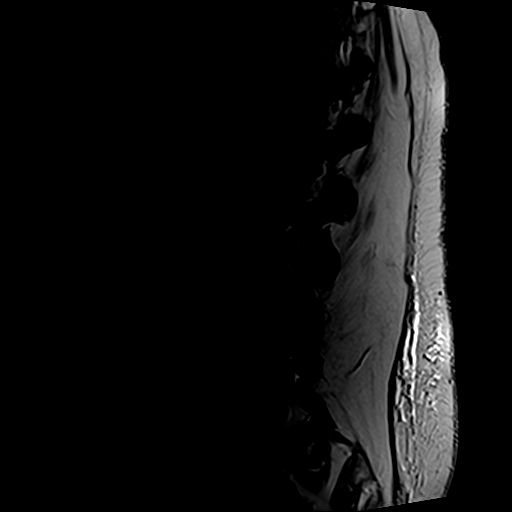
[im 5/13]
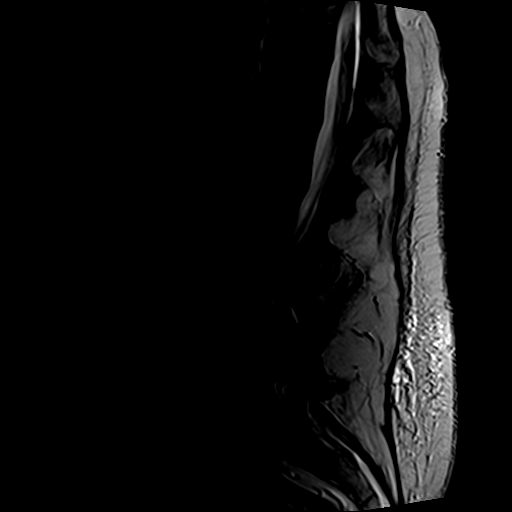
[im 8/13]
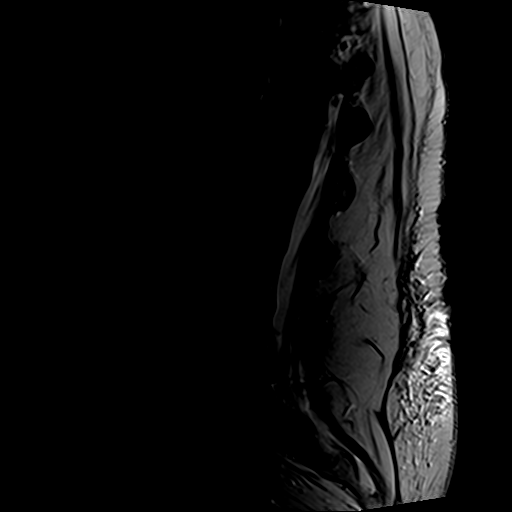
[im 10/13]
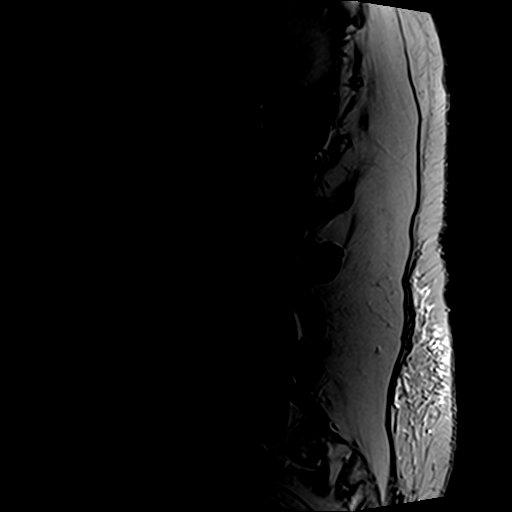
[im 13/13]
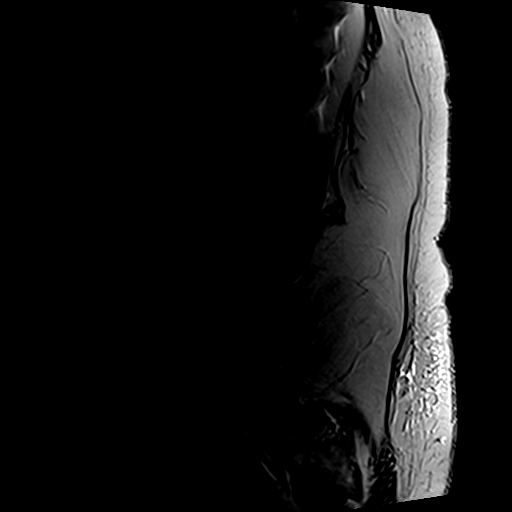

[Series 6: T2 · axial · 4.0mm · 0.78mm/px · z∈[+10,+197]mm · 9 of 35 slices shown]
[im 1/35]
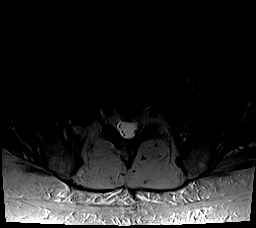
[im 5/35]
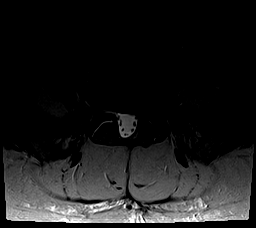
[im 10/35]
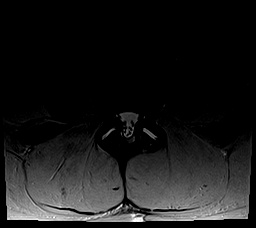
[im 15/35]
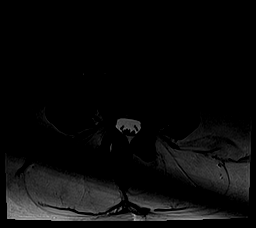
[im 18/35]
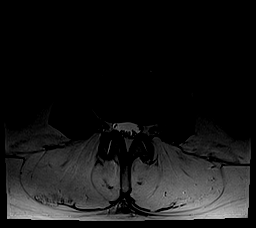
[im 20/35]
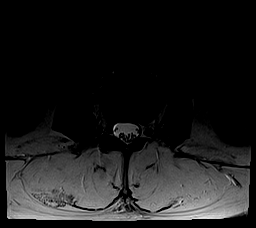
[im 25/35]
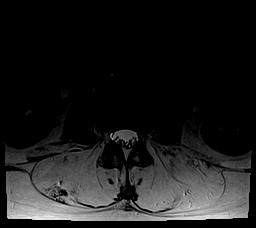
[im 30/35]
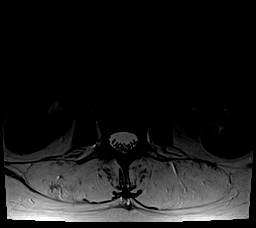
[im 35/35]
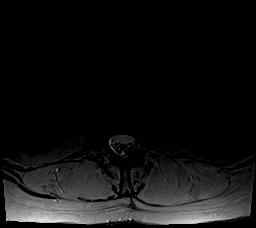

[Series 7: T1 · axial · 4.0mm · 0.39mm/px · z∈[+10,+173]mm · 5 of 35 slices shown (2 of 2)]
[im 1/35]
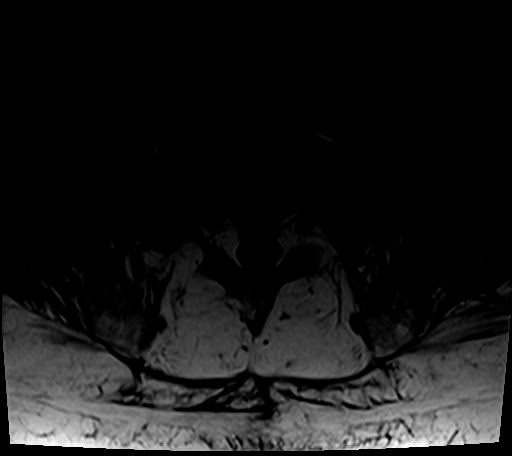
[im 5/35]
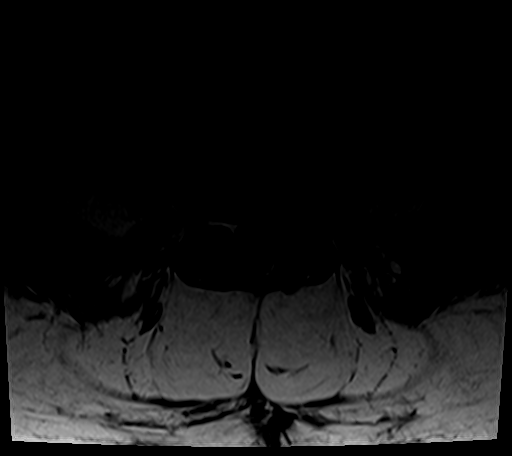
[im 10/35]
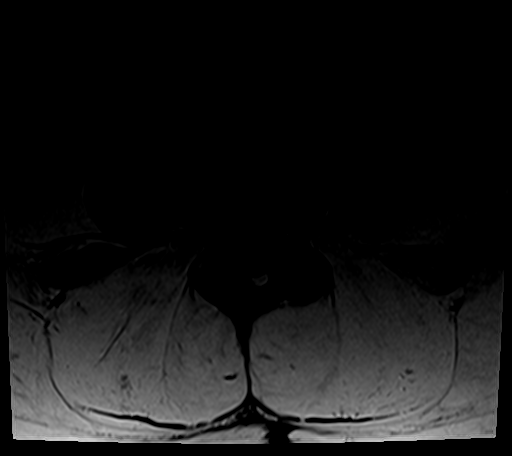
[im 18/35]
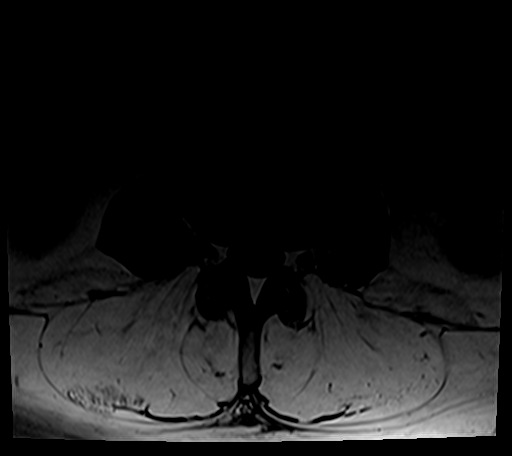
[im 30/35]
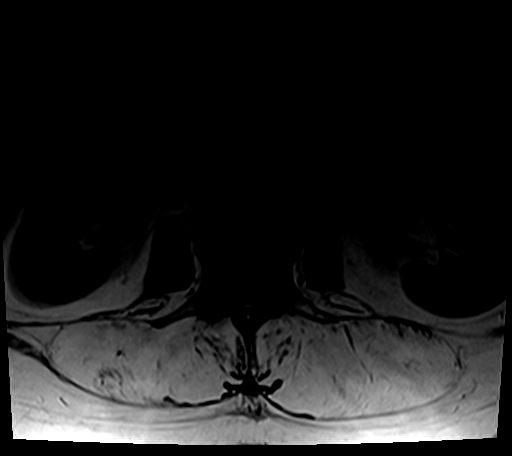

[26 of 48 positions shown; findings below may reference images not displayed]

FINDINGS: Segmentation:  Standard.

Alignment:  Mild retrolisthesis at L5-S1

Vertebrae:  No fracture, evidence of discitis, or bone lesion.

Conus medullaris: Extends to the L1 level and appears normal.

Paraspinal and other soft tissues: Severe fatty atrophy of intrinsic
back muscles in quadratus lumborum, chronic. There is a pseudo
hypertrophy appearance in the intrinsic back muscles.

Disc levels:

T12- L1: Unremarkable.

L1-L2: Unremarkable.

L2-L3: Spondylosis and mild facet spurring.  No impingement.

L3-L4: Minor disc bulging. Mild facet arthropathy with small volume
joint fluid on the right. No impingement

L4-L5: Severe facet arthropathy with effusions and wide joints. Disc
bulging and right foraminal protrusion. Annular fissures noted.
Subarticular recess crowding without L5 compression. Moderate right
foraminal stenosis which could affect the right L4 nerve

L5-S1:Greatest level of degenerative disc narrowing with bulging and
endplate ridging. Facet spurring. Prior laminectomy with patent
spinal canal. Left foraminal stenosis with mild L5 distortion by
chronic foraminal disc protrusion and disc narrowing.
IMPRESSION: 1. L4-5 severe facet arthropathy with gaping joints. Chronic
moderate right foraminal narrowing at this level.
2. L5-S1 chronic moderate left foraminal stenosis, primarily
discogenic. Good canal patency after interval posterior
decompression.
3. Remarkable and progressive fatty infiltration of intrinsic back
muscles and quadratus lumborum - to a degree that adult onset
muscular dystrophy should be considered.

## 2018-08-09 ENCOUNTER — Ambulatory Visit: Payer: 59 | Admitting: Family Medicine

## 2018-12-17 ENCOUNTER — Other Ambulatory Visit: Payer: Self-pay | Admitting: Internal Medicine

## 2019-02-26 ENCOUNTER — Other Ambulatory Visit: Payer: Self-pay | Admitting: Internal Medicine

## 2019-02-26 MED ORDER — PRAVASTATIN SODIUM 10 MG PO TABS: ORAL_TABLET | ORAL | 1 refills | Status: DC

## 2019-02-26 NOTE — Telephone Encounter (Signed)
Medication Refill - Medication:  pravastatin (PRAVACHOL) 10 MG tablet  Has the patient contacted their pharmacy? Yes advised to call office as they have sent multiple requests via fax.   Preferred Pharmacy (with phone number or street name):  [facility] [id number] - [place], [address] AT [phone] (Phone) [phone] (Fax)     Agent: Please be advised that RX refills may take up to 3 business days. We ask that you follow-up with your pharmacy.

## 2019-02-26 NOTE — Telephone Encounter (Signed)
Requested medication (s) are due for refill today:yes  Requested medication (s) are on the active medication list: yes  Last refill:  12/17/2018  Future visit scheduled: yes  Notes to clinic:  Review for refill   Requested Prescriptions  Pending Prescriptions Disp Refills   pravastatin (PRAVACHOL) 10 MG tablet 15 tablet 1    Sig: TAKE 1 TABLET(10 MG) BY MOUTH 3 TIMES A WEEK     Cardiovascular:  Antilipid - Statins Failed - 02/26/2019  2:10 PM      Failed - Total Cholesterol in normal range and within 360 days    Cholesterol  Date Value Ref Range Status  04/24/2018 219 (H) 0 - 200 mg/dL Final    Comment:    ATP III Classification       Desirable:  < 200 mg/dL               Borderline High:  200 - 239 mg/dL          High:  > = 240 mg/dL         Failed - LDL in normal range and within 360 days    LDL Cholesterol  Date Value Ref Range Status  10/27/2017 90 0 - 99 mg/dL Final         Failed - HDL in normal range and within 360 days    HDL  Date Value Ref Range Status  04/24/2018 38.60 (L) >39.00 mg/dL Final         Failed - Triglycerides in normal range and within 360 days    Triglycerides  Date Value Ref Range Status  04/24/2018 234.0 (H) 0.0 - 149.0 mg/dL Final    Comment:    Normal:  <150 mg/dLBorderline High:  150 - 199 mg/dL         Passed - Patient is not pregnant      Passed - Valid encounter within last 12 months    Recent Outpatient Visits          7 months ago Chronic pain of both knees   [place], [place], DO   9 months ago Left hip pain   [place], [place], DO   10 months ago Preventative health care   [place], Claudina Lick, MD   1 year ago Essential hypertension   [place], Claudina Lick, MD   1 year ago Preventative health care   [place], Claudina Lick, MD      Future Appointments          In 1 month [place], Claudina Lick, MD [facility]

## 2019-04-25 NOTE — Patient Instructions (Addendum)
Blood work was ordered.    All other Health Maintenance issues reviewed.   All recommended immunizations and age-appropriate screenings are up-to-date or discussed.  No immunization administered today.   Medications reviewed and updated.  Changes include :   none  Your prescription(s) have been submitted to your pharmacy. Please take as directed and contact our office if you believe you are having problem(s) with the medication(s).    Please followup in 1 year    Health Maintenance, Female Adopting a healthy lifestyle and getting preventive care are important in promoting health and wellness. Ask your health care provider about:  The right schedule for you to have regular tests and exams.  Things you can do on your own to prevent diseases and keep yourself healthy. What should I know about diet, weight, and exercise? Eat a healthy diet   Eat a diet that includes plenty of vegetables, fruits, low-fat dairy products, and lean protein.  Do not eat a lot of foods that are high in solid fats, added sugars, or sodium. Maintain a healthy weight Body mass index (BMI) is used to identify weight problems. It estimates body fat based on height and weight. Your health care provider can help determine your BMI and help you achieve or maintain a healthy weight. Get regular exercise Get regular exercise. This is one of the most important things you can do for your health. Most adults should:  Exercise for at least 150 minutes each week. The exercise should increase your heart rate and make you sweat (moderate-intensity exercise).  Do strengthening exercises at least twice a week. This is in addition to the moderate-intensity exercise.  Spend less time sitting. Even light physical activity can be beneficial. Watch cholesterol and blood lipids Have your blood tested for lipids and cholesterol at 65 years of age, then have this test every 5 years. Have your cholesterol levels checked more  often if:  Your lipid or cholesterol levels are high.  You are older than 65 years of age.  You are at high risk for heart disease. What should I know about cancer screening? Depending on your health history and family history, you may need to have cancer screening at various ages. This may include screening for:  Breast cancer.  Cervical cancer.  Colorectal cancer.  Skin cancer.  Lung cancer. What should I know about heart disease, diabetes, and high blood pressure? Blood pressure and heart disease  High blood pressure causes heart disease and increases the risk of stroke. This is more likely to develop in people who have high blood pressure readings, are of African descent, or are overweight.  Have your blood pressure checked: ? Every 3-5 years if you are 18-39 years of age. ? Every year if you are 40 years old or older. Diabetes Have regular diabetes screenings. This checks your fasting blood sugar level. Have the screening done:  Once every three years after age 40 if you are at a normal weight and have a low risk for diabetes.  More often and at a younger age if you are overweight or have a high risk for diabetes. What should I know about preventing infection? Hepatitis B If you have a higher risk for hepatitis B, you should be screened for this virus. Talk with your health care provider to find out if you are at risk for hepatitis B infection. Hepatitis C Testing is recommended for:  Everyone born from 1945 through 1965.  Anyone with known risk factors for hepatitis   C. Sexually transmitted infections (STIs)  Get screened for STIs, including gonorrhea and chlamydia, if: ? You are sexually active and are younger than 65 years of age. ? You are older than 65 years of age and your health care provider tells you that you are at risk for this type of infection. ? Your sexual activity has changed since you were last screened, and you are at increased risk for chlamydia  or gonorrhea. Ask your health care provider if you are at risk.  Ask your health care provider about whether you are at high risk for HIV. Your health care provider may recommend a prescription medicine to help prevent HIV infection. If you choose to take medicine to prevent HIV, you should first get tested for HIV. You should then be tested every 3 months for as long as you are taking the medicine. Pregnancy  If you are about to stop having your period (premenopausal) and you may become pregnant, seek counseling before you get pregnant.  Take 400 to 800 micrograms (mcg) of folic acid every day if you become pregnant.  Ask for birth control (contraception) if you want to prevent pregnancy. Osteoporosis and menopause Osteoporosis is a disease in which the bones lose minerals and strength with aging. This can result in bone fractures. If you are 65 years old or older, or if you are at risk for osteoporosis and fractures, ask your health care provider if you should:  Be screened for bone loss.  Take a calcium or vitamin D supplement to lower your risk of fractures.  Be given hormone replacement therapy (HRT) to treat symptoms of menopause. Follow these instructions at home: Lifestyle  Do not use any products that contain nicotine or tobacco, such as cigarettes, e-cigarettes, and chewing tobacco. If you need help quitting, ask your health care provider.  Do not use street drugs.  Do not share needles.  Ask your health care provider for help if you need support or information about quitting drugs. Alcohol use  Do not drink alcohol if: ? Your health care provider tells you not to drink. ? You are pregnant, may be pregnant, or are planning to become pregnant.  If you drink alcohol: ? Limit how much you use to 0-1 drink a day. ? Limit intake if you are breastfeeding.  Be aware of how much alcohol is in your drink. In the [place] one drink equals one 12 oz bottle of beer (355 mL), one 5 oz  glass of wine (148 mL), or one 1 oz glass of hard liquor (44 mL). General instructions  Schedule regular health, dental, and eye exams.  Stay current with your vaccines.  Tell your health care provider if: ? You often feel depressed. ? You have ever been abused or do not feel safe at home. Summary  Adopting a healthy lifestyle and getting preventive care are important in promoting health and wellness.  Follow your health care provider's instructions about healthy diet, exercising, and getting tested or screened for diseases.  Follow your health care provider's instructions on monitoring your cholesterol and blood pressure. This information is not intended to replace advice given to you by your health care provider. Make sure you discuss any questions you have with your health care provider. Document Revised: 03/01/2018 Document Reviewed: 03/01/2018 Elsevier Patient Education  2020 [employer]  

## 2019-04-25 NOTE — Progress Notes (Signed)
Subjective:    Patient ID: Lisa [name], female    DOB: 03-May-1955, 64 y.o.   MRN: [id number]  HPI She is here for a physical exam.   She is following with neuro at [facility] and a pain magmenet doctor.    She was having right wrist pain likely related to using the walker.  She was given oral steroids and the swelling she had throughout her body resolved.  She wonders why she was swollen. It was swelling all over and it became very apparent after the steroids when the swelling decreased.  She could put her shoes on better, her hands were no longer swollen.  She has joint pain, but thought that was related to the muscular dystrophy and some osteoarthritis.  The swelling was not contained to the joints only.  She has a family history of thyroid disease and wondered if some of her symptoms were related to a thyroid problem.  She knows the muscular dystrophy can cause fatigue and some of her other symptoms, but she wanted to rule out other causes as well..    Medications and allergies reviewed with patient and updated if appropriate.  Patient Active Problem List   Diagnosis Date Noted  . Vitamin D deficiency 04/26/2019  . Fatigue 04/26/2019  . Degenerative arthritis of left knee 07/05/2018  . Urinary urgency 04/24/2018  . Carotid artery disease ([place]) 10/27/2017  . Subclavian steal syndrome 10/27/2017  . Jaw pain 10/27/2017  . Elevated LFTs 07/18/2017  . Fascioscapulohumeral muscular dystrophy ([place]) 04/21/2017  . Prediabetes 04/28/2016  . Hepatitis C antibody test positive - false positive 04/28/2016  . Left hip pain 04/23/2016  . Right arm pain 04/23/2016  . Arthralgia 04/23/2016  . TMJ arthralgia 05/22/2015  . Hyperlipidemia 12/19/2014  . Essential hypertension 12/19/2014  . History of colonic polyps 12/19/2014  . Left lumbar radiculopathy 05/27/2009  . Left foot drop 05/27/2009    Current Outpatient Medications on File Prior to Visit  Medication Sig Dispense Refill  .  aspirin (ASPIRIN EC) 81 MG EC tablet Take 81 mg by mouth daily. Swallow whole.    . calcium carbonate (TUMS - DOSED IN MG ELEMENTAL CALCIUM) 500 MG chewable tablet Chew 1 tablet by mouth as needed for indigestion or heartburn.    . celecoxib (CELEBREX) 200 MG capsule Take 200 mg by mouth 2 (two) times daily.     . Magnesium 250 MG TABS Take 400 mg by mouth daily.     . Multiple Vitamin (MULTI VITAMIN DAILY PO) Take by mouth.    . Omega-3 Fatty Acids (FISH OIL) 1200 MG CPDR Take 1,200 mg by mouth daily.    . traMADol (ULTRAM) 50 MG tablet Take 50 mg by mouth every 6 (six) hours as needed.    . Vitamin D, Cholecalciferol, 1000 UNITS TABS Take 5,000 Units by mouth daily.     . SOOLANTRA 1 % CREA APP AA ON THE FACE QD  3  . [DISCONTINUED] losartan-hydrochlorothiazide (HYZAAR) 50-12.5 MG tablet Take 1 tablet by mouth daily. 90 tablet 3   No current facility-administered medications on file prior to visit.    Past Medical History:  Diagnosis Date  . Cataract    removed from both eyes  . GERD (gastroesophageal reflux disease)    no issue while on tums  . Heart murmur   . Heavy menses    irregular; on Provera  . Hyperlipidemia    diet controlled  . Hypertension   . Muscular dystrophy,  facioscapulohumeral [facility])     Past Surgical History:  Procedure Laterality Date  . CATARACT EXTRACTION Bilateral   . CESAREAN SECTION     X2  . COLONOSCOPY  2008   [facility]  . LASER ABLATION CONDYLOMA CERVICAL / VULVAR    . LASIK Bilateral   . LUMBAR DISC SURGERY  2011   L4-5 ; Dr Shellia Carwin  . POLYPECTOMY    . VITRECTOMY Left 01-2015    Social History   Socioeconomic History  . Marital status: Married    Spouse name: Not on file  . Number of children: 2  . Years of education: 93  . Highest education level: Not on file  Occupational History  . Occupation: retired Print production planner  Tobacco Use  . Smoking status: Never Smoker  . Smokeless tobacco: Never Used  Substance and Sexual  Activity  . Alcohol use: No    Alcohol/week: 0.0 standard drinks  . Drug use: No  . Sexual activity: Not on file  Other Topics Concern  . Not on file  Social History Narrative   Lives with husband in a 2 story home.  Has 2 children and 3 grandchildren.  Retired Print production planner.  Education: college degree.    Social Determinants of Health   Financial Resource Strain:   . Difficulty of Paying Living Expenses: Not on file  Food Insecurity:   . Worried About Charity fundraiser in the Last Year: Not on file  . Ran Out of Food in the Last Year: Not on file  Transportation Needs:   . Lack of Transportation (Medical): Not on file  . Lack of Transportation (Non-Medical): Not on file  Physical Activity:   . Days of Exercise per Week: Not on file  . Minutes of Exercise per Session: Not on file  Stress:   . Feeling of Stress : Not on file  Social Connections:   . Frequency of Communication with Friends and Family: Not on file  . Frequency of Social Gatherings with Friends and Family: Not on file  . Attends Religious Services: Not on file  . Active Member of Clubs or Organizations: Not on file  . Attends Archivist Meetings: Not on file  . Marital Status: Not on file    Family History  Problem Relation Age of Onset  . Thyroid cancer Mother   . Hypertension Mother   . Heart murmur Mother 75       unknown valve replaced  . Colon polyps Father   . Hyperlipidemia Father   . Atrial fibrillation Father        has PPM  . CAD Father 48       stent placed  . Diabetes Paternal Uncle   . Diabetes Paternal Grandmother   . Heart attack Paternal Grandmother        <65  . Stroke Paternal Grandfather   . Hypertension Sister   . Hyperlipidemia Sister   . Muscular dystrophy Cousin   . Colon cancer Neg Hx   . Esophageal cancer Neg Hx   . Rectal cancer Neg Hx   . Stomach cancer Neg Hx     Review of Systems  Constitutional: Positive for fatigue. Negative for chills and fever.        Difficulty temperature regulation - hot/cold  Eyes: Negative for visual disturbance.  Respiratory: Positive for shortness of breath (deconditioning). Negative for cough and wheezing.   Cardiovascular: Positive for leg swelling. Negative for chest pain and palpitations.  Gastrointestinal:  Positive for constipation (colace, mag). Negative for abdominal pain, blood in stool, diarrhea and nausea.       Occ GERD  Genitourinary: Negative for dysuria and hematuria.  Musculoskeletal: Positive for arthralgias, joint swelling and myalgias.  Skin: Negative for color change and rash.  Neurological: Positive for light-headedness (occ when first stands up). Negative for headaches.  Psychiatric/Behavioral: Negative for dysphoric mood. The patient is not nervous/anxious.        Memory is "horrible"       Objective:   Vitals:   04/26/19 0855 04/26/19 1003  BP: (!) 168/88 (!) 144/82  Pulse: 84   Resp: 18   Temp: 98.6 F (37 C)   SpO2: 96%    Filed Weights   04/26/19 0855  Weight: 232 lb (105.2 kg)   Body mass index is 36.34 kg/m.  BP Readings from Last 3 Encounters:  04/26/19 (!) 144/82  07/05/18 126/72  05/15/18 122/80    Wt Readings from Last 3 Encounters:  04/26/19 232 lb (105.2 kg)  07/05/18 227 lb (103 kg)  05/15/18 222 lb (100.7 kg)     Physical Exam Constitutional: She appears well-developed and well-nourished. No distress.  HENT:  Head: Normocephalic and atraumatic.  Right Ear: External ear normal. Normal ear canal and TM Left Ear: External ear normal.  Normal ear canal and TM Mouth/Throat: Oropharynx is clear and moist.  Eyes: Conjunctivae and EOM are normal.  Neck: Neck supple. No tracheal deviation present. No thyromegaly present.  No carotid bruit  Cardiovascular: Normal rate, regular rhythm and normal heart sounds.   No murmur heard.  No edema. Pulmonary/Chest: Effort normal and breath sounds normal. No respiratory distress. She has no wheezes. She has no  rales.  Breast: deferred   Abdominal: Soft. She exhibits no distension. There is no tenderness.  Lymphadenopathy: She has no cervical adenopathy.  Skin: Skin is warm and dry. She is not diaphoretic.  Psychiatric: She has a normal mood and affect. Her behavior is normal.        Assessment & Plan:   Physical exam: Screening blood work    ordered Immunizations  Deferred flu, td Colonoscopy  Due - she will schedule in the near future Mammogram  Up to date  Gyn  Up to date  Eye exams   Due - will schedule Exercise  None Weight  Has gained weight.  Discussed the importance of a healthy diet and decreased portions.  Discussed the importance of weight loss to help with joint pain and help mobility Substance abuse   none  See Problem List for Assessment and Plan of chronic medical problems.    This visit occurred during the SARS-CoV-2 public health emergency.  Safety protocols were in place, including screening questions prior to the visit, additional usage of staff PPE, and extensive cleaning of exam room while observing appropriate contact time as indicated for disinfecting solutions.

## 2019-04-26 ENCOUNTER — Other Ambulatory Visit: Payer: Self-pay

## 2019-04-26 ENCOUNTER — Ambulatory Visit (INDEPENDENT_AMBULATORY_CARE_PROVIDER_SITE_OTHER): Payer: 59 | Admitting: Internal Medicine

## 2019-04-26 ENCOUNTER — Encounter: Payer: Self-pay | Admitting: Internal Medicine

## 2019-04-26 VITALS — BP 144/82 | HR 84 | Temp 98.6°F | Resp 18 | Ht 67.0 in | Wt 232.0 lb

## 2019-04-26 DIAGNOSIS — R7303 Prediabetes: Secondary | ICD-10-CM | POA: Diagnosis not present

## 2019-04-26 DIAGNOSIS — M2550 Pain in unspecified joint: Secondary | ICD-10-CM | POA: Diagnosis not present

## 2019-04-26 DIAGNOSIS — E559 Vitamin D deficiency, unspecified: Secondary | ICD-10-CM | POA: Insufficient documentation

## 2019-04-26 DIAGNOSIS — G7102 Facioscapulohumeral muscular dystrophy: Secondary | ICD-10-CM

## 2019-04-26 DIAGNOSIS — E7849 Other hyperlipidemia: Secondary | ICD-10-CM

## 2019-04-26 DIAGNOSIS — I10 Essential (primary) hypertension: Secondary | ICD-10-CM | POA: Diagnosis not present

## 2019-04-26 DIAGNOSIS — R5383 Other fatigue: Secondary | ICD-10-CM

## 2019-04-26 DIAGNOSIS — Z0000 Encounter for general adult medical examination without abnormal findings: Secondary | ICD-10-CM

## 2019-04-26 LAB — LIPID PANEL
Cholesterol: 287 mg/dL — ABNORMAL HIGH (ref 0–200)
HDL: 46.4 mg/dL (ref >39.00)
NonHDL: 240.88
Total CHOL/HDL Ratio: 6
Triglycerides: 201 mg/dL — ABNORMAL HIGH (ref 0.0–149.0)
VLDL: 40.2 mg/dL — ABNORMAL HIGH (ref 0.0–40.0)

## 2019-04-26 LAB — C-REACTIVE PROTEIN: CRP: <1 mg/dL (ref 0.5–20.0)

## 2019-04-26 LAB — COMPREHENSIVE METABOLIC PANEL
ALT: 70 U/L — ABNORMAL HIGH (ref 0–35)
AST: 37 U/L (ref 0–37)
Albumin: 4.2 g/dL (ref 3.5–5.2)
Alkaline Phosphatase: 94 U/L (ref 39–117)
BUN: 16 mg/dL (ref 6–23)
CO2: 28 mEq/L (ref 19–32)
Calcium: 9.9 mg/dL (ref 8.4–10.5)
Chloride: 101 mEq/L (ref 96–112)
Creatinine, Ser: 0.59 mg/dL (ref 0.40–1.20)
GFR: 102.58 mL/min (ref >60.00)
Glucose, Bld: 116 mg/dL — ABNORMAL HIGH (ref 70–99)
Potassium: 3.7 mEq/L (ref 3.5–5.1)
Sodium: 137 mEq/L (ref 135–145)
Total Bilirubin: 0.5 mg/dL (ref 0.2–1.2)
Total Protein: 7.3 g/dL (ref 6.0–8.3)

## 2019-04-26 LAB — CBC WITH DIFFERENTIAL/PLATELET
Basophils Absolute: 0 10*3/uL (ref 0.0–0.1)
Basophils Relative: 0.5 % (ref 0.0–3.0)
Eosinophils Absolute: 0.2 10*3/uL (ref 0.0–0.7)
Eosinophils Relative: 2.4 % (ref 0.0–5.0)
HCT: 42.9 % (ref 36.0–46.0)
Hemoglobin: 14.2 g/dL (ref 12.0–15.0)
Lymphocytes Relative: 24.1 % (ref 12.0–46.0)
Lymphs Abs: 1.8 10*3/uL (ref 0.7–4.0)
MCHC: 33.2 g/dL (ref 30.0–36.0)
MCV: 90.3 fl (ref 78.0–100.0)
Monocytes Absolute: 0.7 10*3/uL (ref 0.1–1.0)
Monocytes Relative: 8.7 % (ref 3.0–12.0)
Neutro Abs: 4.9 10*3/uL (ref 1.4–7.7)
Neutrophils Relative %: 64.3 % (ref 43.0–77.0)
Platelets: 247 10*3/uL (ref 150.0–400.0)
RBC: 4.75 Mil/uL (ref 3.87–5.11)
RDW: 14 % (ref 11.5–15.5)
WBC: 7.6 10*3/uL (ref 4.0–10.5)

## 2019-04-26 LAB — SEDIMENTATION RATE: Sed Rate: 29 mm/hr (ref 0–30)

## 2019-04-26 LAB — HEMOGLOBIN A1C: Hgb A1c MFr Bld: 5.8 % (ref 4.6–6.5)

## 2019-04-26 LAB — T3, FREE: T3, Free: 3.7 pg/mL (ref 2.3–4.2)

## 2019-04-26 LAB — VITAMIN D 25 HYDROXY (VIT D DEFICIENCY, FRACTURES): VITD: 48.82 ng/mL (ref 30.00–100.00)

## 2019-04-26 LAB — LDL CHOLESTEROL, DIRECT: Direct LDL: 194 mg/dL

## 2019-04-26 LAB — TSH: TSH: 1.69 u[IU]/mL (ref 0.35–4.50)

## 2019-04-26 LAB — T4, FREE: Free T4: 1 ng/dL (ref 0.60–1.60)

## 2019-04-26 MED ORDER — LOSARTAN POTASSIUM 100 MG PO TABS: 100.0000 mg | ORAL_TABLET | Freq: Every day | ORAL | 3 refills | Status: DC

## 2019-04-26 MED ORDER — PRAVASTATIN SODIUM 10 MG PO TABS: ORAL_TABLET | ORAL | 3 refills | Status: DC

## 2019-04-26 MED ORDER — HYDROCHLOROTHIAZIDE 25 MG PO TABS: ORAL_TABLET | ORAL | 3 refills | Status: DC

## 2019-04-26 NOTE — Assessment & Plan Note (Signed)
Taking 5000 units daily Will check level

## 2019-04-26 NOTE — Assessment & Plan Note (Signed)
Chronic Using a walker to ambulate and it is becoming more difficult Primarily left-sided weakness, which is getting worse Following with pain management and neurology

## 2019-04-26 NOTE — Assessment & Plan Note (Addendum)
Joint pain and overall swelling Her muscular dystrophy and osteoarthritis are likely contributing, but will rule out autoimmune causes-autoimmune blood work in the past has been negative Check autoimmune labs - esr, crp, ana

## 2019-04-26 NOTE — Assessment & Plan Note (Signed)
Chronic She is experiencing fatigue She notes her muscular dystrophy is likely contributing, but is concerned about other contributing causes, specifically thyroid disease since it runs in her family CBC, CMP, TFTs

## 2019-04-26 NOTE — Assessment & Plan Note (Signed)
Chronic Check lipid panel  Continue daily statin Weight loss and healthy diet encouraged

## 2019-04-26 NOTE — Assessment & Plan Note (Signed)
Chronic Blood pressure elevated here today, better on repeat She will start monitoring regularly at home Continue current medications at current doses CMP

## 2019-04-26 NOTE — Assessment & Plan Note (Signed)
Chronic Check a1c Low sugar / carb diet Encouraged decrease portions and weight loss

## 2019-04-27 LAB — ANA: Anti Nuclear Antibody (ANA): NEGATIVE

## 2019-04-29 ENCOUNTER — Encounter: Payer: Self-pay | Admitting: Internal Medicine

## 2019-06-08 ENCOUNTER — Ambulatory Visit: Payer: 59 | Admitting: Family Medicine

## 2019-06-08 ENCOUNTER — Encounter: Payer: Self-pay | Admitting: Family Medicine

## 2019-06-08 ENCOUNTER — Other Ambulatory Visit: Payer: Self-pay

## 2019-06-08 ENCOUNTER — Ambulatory Visit (INDEPENDENT_AMBULATORY_CARE_PROVIDER_SITE_OTHER): Payer: 59

## 2019-06-08 VITALS — BP 118/86 | HR 106 | Ht 67.0 in | Wt 226.0 lb

## 2019-06-08 DIAGNOSIS — M25531 Pain in right wrist: Secondary | ICD-10-CM | POA: Diagnosis not present

## 2019-06-08 DIAGNOSIS — M21372 Foot drop, left foot: Secondary | ICD-10-CM

## 2019-06-08 DIAGNOSIS — M1811 Unilateral primary osteoarthritis of first carpometacarpal joint, right hand: Secondary | ICD-10-CM

## 2019-06-08 MED ORDER — AMBULATORY NON FORMULARY MEDICATION: 1.0000 [IU] | Freq: Once | 0 refills | Status: DC

## 2019-06-08 NOTE — Progress Notes (Signed)
[place] Phone: [phone] Subjective:   Fontaine No, am serving as a scribe for Dr. Hulan Saas. This visit occurred during the SARS-CoV-2 public health emergency.  Safety protocols were in place, including screening questions prior to the visit, additional usage of staff PPE, and extensive cleaning of exam room while observing appropriate contact time as indicated for disinfecting solutions.   I'm seeing this patient by the request  of:  Binnie Rail, MD  CC: Right wrist pain  [id number]   07/05/2018 Patient does have more of a left knee degenerative changes.  Discussed icing regimen and home exercise.  Discussed which activities of doing which wants to avoid.  Patient is to increase activity slowly.  X-rays are pending.  I do believe patient would be a candidate for Visco supplementation.  Patient wants to avoid any surgical intervention.  Hold on any type of bracing at the moment.  I believe the foot drop would be more beneficial.  Likely from back surgery.  Could be also secondary to the muscular dystrophy.  Patient still does not want an AFO but I do think it is giving difficulty.  Discussed icing regimen and home exercise discussed avoiding certain activities.  Update 06/08/2019 LOEVA MEANOR is a 64 y.o. female coming in with complaint of right wrist pain, and foot drop. Patient states that her right hand takes on the majority of the pressure on her walker due to MS causing weakness in left side. Pain over [place] joint in thumb that radiates up into forearm. Sharp pain with use.   Would also like to speak about AFO for left ankle due to foot drop.  Patient is very concerned that she will trip and fall.  Using a walker on a regular basis.  Has been doing stretches for hip and back. Pain has subsided at this time.       Past Medical History:  Diagnosis Date  . Cataract    removed from both  eyes  . GERD (gastroesophageal reflux disease)    no issue while on tums  . Heart murmur   . Heavy menses    irregular; on Provera  . Hyperlipidemia    diet controlled  . Hypertension   . Muscular dystrophy, facioscapulohumeral [facility])    Past Surgical History:  Procedure Laterality Date  . CATARACT EXTRACTION Bilateral   . CESAREAN SECTION     X2  . COLONOSCOPY  2008   [facility]  . LASER ABLATION CONDYLOMA CERVICAL / VULVAR    . LASIK Bilateral   . LUMBAR DISC SURGERY  2011   L4-5 ; Dr Shellia Carwin  . POLYPECTOMY    . VITRECTOMY Left 01-2015   Social History   Socioeconomic History  . Marital status: Married    Spouse name: Not on file  . Number of children: 2  . Years of education: 28  . Highest education level: Not on file  Occupational History  . Occupation: retired Print production planner  Tobacco Use  . Smoking status: Never Smoker  . Smokeless tobacco: Never Used  Substance and Sexual Activity  . Alcohol use: No    Alcohol/week: 0.0 standard drinks  . Drug use: No  . Sexual activity: Not on file  Other Topics Concern  . Not on file  Social History Narrative   Lives with husband in a 2 story home.  Has 2 children and 3 grandchildren.  Retired Print production planner.  Education: college degree.    Social Determinants of Health   Financial Resource Strain:   . Difficulty of Paying Living Expenses:   Food Insecurity:   . Worried About Charity fundraiser in the Last Year:   . Arboriculturist in the Last Year:   Transportation Needs:   . Film/video editor (Medical):   Marland Kitchen Lack of Transportation (Non-Medical):   Physical Activity:   . Days of Exercise per Week:   . Minutes of Exercise per Session:   Stress:   . Feeling of Stress :   Social Connections:   . Frequency of Communication with Friends and Family:   . Frequency of Social Gatherings with Friends and Family:   . Attends Religious Services:   . Active Member of Clubs or Organizations:   . Attends English as a second language teacher Meetings:   Marland Kitchen Marital Status:    Allergies  Allergen Reactions  . Atorvastatin     Muscle pain  . Crestor [Rosuvastatin Calcium]   . Lisinopril Cough   Family History  Problem Relation Age of Onset  . Thyroid cancer Mother   . Hypertension Mother   . Heart murmur Mother 76       unknown valve replaced  . Colon polyps Father   . Hyperlipidemia Father   . Atrial fibrillation Father        has PPM  . CAD Father 81       stent placed  . Diabetes Paternal Uncle   . Diabetes Paternal Grandmother   . Heart attack Paternal Grandmother        <65  . Stroke Paternal Grandfather   . Hypertension Sister   . Hyperlipidemia Sister   . Muscular dystrophy Cousin   . Colon cancer Neg Hx   . Esophageal cancer Neg Hx   . Rectal cancer Neg Hx   . Stomach cancer Neg Hx      Current Outpatient Medications (Cardiovascular):  .  hydrochlorothiazide (HYDRODIURIL) 25 MG tablet, TAKE 1 TABLET(25 MG) BY MOUTH DAILY .  losartan (COZAAR) 100 MG tablet, Take 1 tablet (100 mg total) by mouth daily. .  pravastatin (PRAVACHOL) 10 MG tablet, TAKE 1 TABLET(10 MG) BY MOUTH 3 TIMES A WEEK   Current Outpatient Medications (Analgesics):  .  aspirin (ASPIRIN EC) 81 MG EC tablet, Take 81 mg by mouth daily. Swallow whole. .  celecoxib (CELEBREX) 200 MG capsule, Take 200 mg by mouth 2 (two) times daily.  .  traMADol (ULTRAM) 50 MG tablet, Take 50 mg by mouth every 6 (six) hours as needed.   Current Outpatient Medications (Other):  .  calcium carbonate (TUMS - DOSED IN MG ELEMENTAL CALCIUM) 500 MG chewable tablet, Chew 1 tablet by mouth as needed for indigestion or heartburn. .  Magnesium 250 MG TABS, Take 400 mg by mouth daily.  .  Multiple Vitamin (MULTI VITAMIN DAILY PO), Take by mouth. .  Omega-3 Fatty Acids (FISH OIL) 1200 MG CPDR, Take 1,200 mg by mouth daily. .  SOOLANTRA 1 % CREA, APP AA ON THE FACE QD .  Vitamin D, Cholecalciferol, 1000 UNITS TABS, Take 5,000 Units by mouth daily.   .  AMBULATORY NON FORMULARY MEDICATION, 1 Units by Other route once for 1 dose.   Reviewed prior external information including notes and imaging from  primary care provider As well as notes that were available from care everywhere and other healthcare systems.  Past medical history, social, surgical and family history all reviewed  in electronic medical record.  No pertanent information unless stated regarding to the chief complaint.   Review of Systems:  No headache, visual changes, nausea, vomiting, diarrhea, constipation, dizziness, abdominal pain, skin rash, fevers, chills, night sweats, weight loss, swollen lymph nodes, body aches, joint swelling, chest pain, shortness of breath, mood changes. POSITIVE muscle aches  Objective  Blood pressure 118/86, pulse (!) 106, height 5\' 7"  (1.702 m), weight 226 lb (102.5 kg), last menstrual period 09/30/2014, SpO2 97 %.   General: No apparent distress alert and oriented x3 mood and affect normal, dressed appropriately.  Overweight  HEENT: Pupils equal, extraocular movements intact  Respiratory: Patient's speak in full sentences and does not appear short of breath  Cardiovascular: Trace lower extremity edema, non tender, no erythema  Skin: Warm dry intact with no signs of infection or rash on extremities or on axial skeleton.  Abdomen: Soft nontender  Gait severely antalgic with foot drop noted on the left side.  Using a rolling walker..  Patient's right wrist shows the patient does have some mild atrophy of the thenar eminence but near full same as the contralateral side.  Patient's grip strength 4-5 but symmetric to the contralateral side  Left leg exam shows the patient does have foot drop with only 2+ out of 5 strength with dorsiflexion of the foot.  Deep tendon reflexes is down with 1+ of the Achilles on the left compared to the right he does have arthritic changes noted of the knee as well on the side.  Limited musculoskeletal ultrasound was  performed and interpreted by Lyndal Pulley  Limited ultrasound of patient's right wrist shows the patient does have moderate to severe narrowing of the [facility] joint noted.  Capsulitis noted with hypoechoic changes coming from the joint. Impression: CMC arthritis   Impression and Recommendations:     This case required medical decision making of moderate complexity. The above documentation has been reviewed and is accurate and complete Lyndal Pulley, DO       Note: This dictation was prepared with Dragon dictation along with smaller phrase technology. Any transcriptional errors that result from this process are unintentional.

## 2019-06-08 NOTE — Patient Instructions (Addendum)
[place], [place] [address] [place], [place]  [phone]  Splint day and night for 2 weeks, then nightly for 2 weeks Pennsaid 2x a day as needed, finger tip sized amount See me in 4 weeks, if not better will do injections

## 2019-06-08 NOTE — Assessment & Plan Note (Signed)
Arthritic changes of the right right thumb noted on ultrasound today.  Does have a capsulitis noted that likely contributing to some of the aches and pains as well.  Patient declined an injection today.  Thumb spica splint given.  Do think it would be beneficial.  Mild range of motion, topical anti-inflammatories, follow-up again in 4 weeks worsening pain consider injection

## 2019-06-08 NOTE — Assessment & Plan Note (Signed)
Patient did have a history of the back surgery.  Continues to have difficulty with the foot drop.  Patient also has a history of a muscular dystrophy derivative.  These chronic issues make it very difficult to treat.  I do believe secondary to patient being a fall risk that an AFO would be beneficial at this time.  Patient will be fitted for this.  Discussed the other pain medications that patient has been doing.  Discussed icing regimen and home exercises.  Proper shoes.  Follow-up again in 4 to 6 weeks

## 2019-06-11 ENCOUNTER — Other Ambulatory Visit: Payer: Self-pay

## 2019-06-11 MED ORDER — AMBULATORY NON FORMULARY MEDICATION: 1.0000 [IU] | Freq: Once | 0 refills | Status: AC

## 2019-07-09 ENCOUNTER — Ambulatory Visit: Payer: 59 | Admitting: Family Medicine

## 2019-07-18 ENCOUNTER — Other Ambulatory Visit: Payer: Self-pay

## 2019-07-18 ENCOUNTER — Ambulatory Visit: Payer: 59 | Admitting: Family Medicine

## 2019-07-18 ENCOUNTER — Ambulatory Visit (INDEPENDENT_AMBULATORY_CARE_PROVIDER_SITE_OTHER): Payer: 59

## 2019-07-18 VITALS — BP 116/78 | HR 109 | Ht 67.0 in | Wt 230.0 lb

## 2019-07-18 DIAGNOSIS — M21372 Foot drop, left foot: Secondary | ICD-10-CM | POA: Diagnosis not present

## 2019-07-18 DIAGNOSIS — G7102 Facioscapulohumeral muscular dystrophy: Secondary | ICD-10-CM

## 2019-07-18 DIAGNOSIS — M1811 Unilateral primary osteoarthritis of first carpometacarpal joint, right hand: Secondary | ICD-10-CM

## 2019-07-18 DIAGNOSIS — M654 Radial styloid tenosynovitis [de Quervain]: Secondary | ICD-10-CM | POA: Diagnosis not present

## 2019-07-18 DIAGNOSIS — M25531 Pain in right wrist: Secondary | ICD-10-CM

## 2019-07-18 MED ORDER — AMBULATORY NON FORMULARY MEDICATION: 1.0000 [IU] | Freq: Once | 0 refills | Status: AC

## 2019-07-18 NOTE — Patient Instructions (Signed)
Injected wrist Xray today Rx for wheeling walker See me again in 6-8 weeks

## 2019-07-18 NOTE — Progress Notes (Signed)
[facility] [place] Phone: [phone] Subjective:   Lisa [name], am serving as a scribe for Dr. Hulan Saas. This visit occurred during the SARS-CoV-2 public health emergency.  Safety protocols were in place, including screening questions prior to the visit, additional usage of staff PPE, and extensive cleaning of exam room while observing appropriate contact time as indicated for disinfecting solutions.   I'm seeing this patient by the request  of:  Binnie Rail, MD  CC: Thumb pain and foot pain follow-up  [id number]   06/08/2019 Arthritic changes of the right right thumb noted on ultrasound today.  Does have a capsulitis noted that likely contributing to some of the aches and pains as well.  Patient declined an injection today.  Thumb spica splint given.  Do think it would be beneficial.  Mild range of motion, topical anti-inflammatories, follow-up again in 4 weeks worsening pain consider injection  Patient did have a history of the back surgery.  Continues to have difficulty with the foot drop.  Patient also has a history of a muscular dystrophy derivative.  These chronic issues make it very difficult to treat.  I do believe secondary to patient being a fall risk that an AFO would be beneficial at this time.  Patient will be fitted for this.  Discussed the other pain medications that patient has been doing.  Discussed icing regimen and home exercises.  Proper shoes.  Follow-up again in 4 to 6 weeks  Update 07/18/2019 Lisa [name] is a 64 y.o. female coming in with complaint of right wrist pain and left foot drop. Patient states that she continues to have issues with foot drop. Is wearing brace from [facility] which is helping minimally.  Patient states that the most difficult part is to be able to start walking more regularly.  Patient states that she has been walking differently secondary to the foot drop and is  trying to learn how to do it.  [name] pain with the brace.  Continues to have wrist pain. Pain into the carpal bones and into the distal radius. Leg brace has helped her put less pressure on the right hand. Wears brace at night for pain relief. Pain is less in the [facility] joint. Pain with brushing teeth.  Patient notices with certain twisting motions causes a severe pain that goes up her wrist.       Past Medical History:  Diagnosis Date  . Cataract    removed from both eyes  . GERD (gastroesophageal reflux disease)    [name] issue while on tums  . Heart murmur   . Heavy menses    irregular; on Provera  . Hyperlipidemia    diet controlled  . Hypertension   . Muscular dystrophy, facioscapulohumeral [facility])    Past Surgical History:  Procedure Laterality Date  . CATARACT EXTRACTION Bilateral   . CESAREAN SECTION     X2  . COLONOSCOPY  2008   [facility]  . LASER ABLATION CONDYLOMA CERVICAL / VULVAR    . LASIK Bilateral   . LUMBAR DISC SURGERY  2011   L4-5 ; Dr Shellia Carwin  . POLYPECTOMY    . VITRECTOMY Left 01-2015   Social History   Socioeconomic History  . Marital status: Married    Spouse name: Not on file  . Number of children: 2  . Years of education: 52  . Highest education level: Not on file  Occupational History  .  Occupation: retired Print production planner  Tobacco Use  . Smoking status: Never Smoker  . Smokeless tobacco: Never Used  Substance and Sexual Activity  . Alcohol use: [name]    Alcohol/week: 0.0 standard drinks  . Drug use: [name]  . Sexual activity: Not on file  Other Topics Concern  . Not on file  Social History Narrative   Lives with husband in a 2 story home.  Has 2 children and 3 grandchildren.  Retired Print production planner.  Education: college degree.    Social Determinants of Health   Financial Resource Strain:   . Difficulty of Paying Living Expenses:   Food Insecurity:   . Worried About Charity fundraiser in the Last Year:   . Arboriculturist in the Last  Year:   Transportation Needs:   . Film/video editor (Medical):   Marland Kitchen Lack of Transportation (Non-Medical):   Physical Activity:   . Days of Exercise per Week:   . Minutes of Exercise per Session:   Stress:   . Feeling of Stress :   Social Connections:   . Frequency of Communication with Friends and Family:   . Frequency of Social Gatherings with Friends and Family:   . Attends Religious Services:   . Active Member of Clubs or Organizations:   . Attends Archivist Meetings:   Marland Kitchen Marital Status:    Allergies  Allergen Reactions  . Atorvastatin     Muscle pain  . Crestor [Rosuvastatin Calcium]   . Lisinopril Cough   Family History  Problem Relation Age of Onset  . Thyroid cancer Mother   . Hypertension Mother   . Heart murmur Mother 37       unknown valve replaced  . Colon polyps Father   . Hyperlipidemia Father   . Atrial fibrillation Father        has PPM  . CAD Father 33       stent placed  . Diabetes Paternal Uncle   . Diabetes Paternal Grandmother   . Heart attack Paternal Grandmother        <65  . Stroke Paternal Grandfather   . Hypertension Sister   . Hyperlipidemia Sister   . Muscular dystrophy Cousin   . Colon cancer Neg Hx   . Esophageal cancer Neg Hx   . Rectal cancer Neg Hx   . Stomach cancer Neg Hx      Current Outpatient Medications (Cardiovascular):  .  hydrochlorothiazide (HYDRODIURIL) 25 MG tablet, TAKE 1 TABLET(25 MG) BY MOUTH DAILY .  losartan (COZAAR) 100 MG tablet, Take 1 tablet (100 mg total) by mouth daily. .  pravastatin (PRAVACHOL) 10 MG tablet, TAKE 1 TABLET(10 MG) BY MOUTH 3 TIMES A WEEK   Current Outpatient Medications (Analgesics):  .  aspirin (ASPIRIN EC) 81 MG EC tablet, Take 81 mg by mouth daily. Swallow whole. .  celecoxib (CELEBREX) 200 MG capsule, Take 200 mg by mouth 2 (two) times daily.  .  traMADol (ULTRAM) 50 MG tablet, Take 50 mg by mouth every 6 (six) hours as needed.   Current Outpatient Medications  (Other):  .  calcium carbonate (TUMS - DOSED IN MG ELEMENTAL CALCIUM) 500 MG chewable tablet, Chew 1 tablet by mouth as needed for indigestion or heartburn. .  Magnesium 250 MG TABS, Take 400 mg by mouth daily.  .  Multiple Vitamin (MULTI VITAMIN DAILY PO), Take by mouth. .  Omega-3 Fatty Acids (FISH OIL) 1200 MG CPDR, Take 1,200 mg by mouth  daily. .  SOOLANTRA 1 % CREA, APP AA ON THE FACE QD .  Vitamin D, Cholecalciferol, 1000 UNITS TABS, Take 5,000 Units by mouth daily.    Reviewed prior external information including notes and imaging from  primary care provider As well as notes that were available from care everywhere and other healthcare systems.  Past medical history, social, surgical and family history all reviewed in electronic medical record.  [name] pertanent information unless stated regarding to the chief complaint.   Review of Systems:  [name] headache, visual changes, nausea, vomiting, diarrhea, constipation, dizziness, abdominal pain, skin rash, fevers, chills, night sweats, weight loss, swollen lymph nodes, body aches, joint swelling, chest pain, shortness of breath, mood changes. POSITIVE muscle aches  Objective  Blood pressure 116/78, pulse (!) 109, height 5\' 7"  (1.702 m), weight 230 lb (104.3 kg), last menstrual period 09/30/2014, SpO2 96 %.   General: [name] apparent distress alert and oriented x3 mood and affect normal, dressed appropriately.  HEENT: Pupils equal, extraocular movements intact  Respiratory: Patient's speak in full sentences and does not appear short of breath  Cardiovascular: [name] lower extremity edema, non tender, [name] erythema  Patient does have a walker Left leg shows that patient continues to have a foot drop with 2+ out of 5 strength with dorsiflexion.  Decreased deep tendon reflexes on the left and right.   Right wrist exam shows the patient does still have a mild positive CMC joint grind but patient has a positive Finkelstein's.  This is significantly different  than previous exam.  Mild swelling over the abductor pollicis longus noted as well.  After verbal consent patient was prepped with alcohol swab and with a 25-gauge half inch needle injected into the flexor pollicis brevis tendon sheath.  A total of 0.5 cc of 0.5% Marcaine and 0.5 cc of Kenalog 40 mg/mL used.  [name] blood loss.  Postinjection instructions given   Impression and Recommendations:     This case required medical decision making of moderate complexity. The above documentation has been reviewed and is accurate and complete Lyndal Pulley, DO       Note: This dictation was prepared with Dragon dictation along with smaller phrase technology. Any transcriptional errors that result from this process are unintentional.

## 2019-07-18 NOTE — Assessment & Plan Note (Signed)
ActivityPatient given injection today, tolerated the procedure well, discussed icing regimen and home exercise, which activities to do which wants to avoid.  Slowly.  Icing regimen.  Follow-up again in 4 to 8 weeks

## 2019-07-18 NOTE — Assessment & Plan Note (Signed)
We will continue to monitor the patient seems to be doing relatively well at the moment.  Follow-up again in 6 to 8 weeks x-rays pending

## 2019-07-19 ENCOUNTER — Encounter: Payer: Self-pay | Admitting: Family Medicine

## 2019-07-19 NOTE — Assessment & Plan Note (Signed)
Patient has had surgery previously but also does have a muscular dystrophy but I do think contributes to some of the foot drop.  Continue with the AFO.  We discussed the potential for patient having more training the patient has declined at the moment.  Patient will follow up with me again in 2 to 3 months

## 2019-08-29 ENCOUNTER — Ambulatory Visit: Payer: 59 | Admitting: Family Medicine

## 2019-08-29 ENCOUNTER — Other Ambulatory Visit: Payer: Self-pay

## 2019-08-29 ENCOUNTER — Encounter: Payer: Self-pay | Admitting: Family Medicine

## 2019-08-29 VITALS — BP 146/90 | HR 87 | Ht 67.0 in | Wt 230.0 lb

## 2019-08-29 DIAGNOSIS — M654 Radial styloid tenosynovitis [de Quervain]: Secondary | ICD-10-CM | POA: Diagnosis not present

## 2019-08-29 DIAGNOSIS — M1811 Unilateral primary osteoarthritis of first carpometacarpal joint, right hand: Secondary | ICD-10-CM | POA: Diagnosis not present

## 2019-08-29 DIAGNOSIS — M21372 Foot drop, left foot: Secondary | ICD-10-CM | POA: Diagnosis not present

## 2019-08-29 DIAGNOSIS — G7102 Facioscapulohumeral muscular dystrophy: Secondary | ICD-10-CM | POA: Diagnosis not present

## 2019-08-29 NOTE — Patient Instructions (Addendum)
Good to see you Glad you are doing better Try coband during the day to the wrist Keep using brace at night Xelero or hoka arahi shoes I do like the brace for the foot better See me again in 3 months or as needed

## 2019-08-29 NOTE — Assessment & Plan Note (Signed)
Improvement noted after the injections.  Discussed bracing when necessary.  Follow-up again as needed

## 2019-08-29 NOTE — Progress Notes (Signed)
[place] [address] [place] Phone: [phone] Subjective:   I Lisa [name] am serving as a Education administrator for Dr. Hulan Saas.  This visit occurred during the SARS-CoV-2 public health emergency.  Safety protocols were in place, including screening questions prior to the visit, additional usage of staff PPE, and extensive cleaning of exam room while observing appropriate contact time as indicated for disinfecting solutions.   I'm seeing this patient by the request  of:  Binnie Rail, MD  CC: Hand pain, wrist pain follow-up and leg pain follow-up  [id number]   07/18/2019 Patient has had surgery previously but also does have a muscular dystrophy but I do think contributes to some of the foot drop.  Continue with the AFO.  We discussed the potential for patient having more training the patient has declined at the moment.  Patient will follow up with me again in 2 to 3 months  We will continue to monitor the patient seems to be doing relatively well at the moment.  Follow-up again in 6 to 8 weeks x-rays pending  ActivityPatient given injection today, tolerated the procedure well, discussed icing regimen and home exercise, which activities to do which wants to avoid.  Slowly.  Icing regimen.  Follow-up again in 4 to 8 weeks   Update 08/29/2019 Lisa [name] is a 64 y.o. female coming in with complaint of right wrist, right CMC joint and left foot drop. Patient states her wrist is doing great. Injection helped. Thumb is ok only painful with iPad. Patient states foot drop is doing ok. Still getting used to the brace. Still wearing wrist brace at night.  Patient states overall she does feel like she has made significant improvement at this time.     Past Medical History:  Diagnosis Date  . Cataract    removed from both eyes  . GERD (gastroesophageal reflux disease)    no issue while on tums  . Heart murmur   . Heavy menses     irregular; on Provera  . Hyperlipidemia    diet controlled  . Hypertension   . Muscular dystrophy, facioscapulohumeral [facility])    Past Surgical History:  Procedure Laterality Date  . CATARACT EXTRACTION Bilateral   . CESAREAN SECTION     X2  . COLONOSCOPY  2008   [facility]  . LASER ABLATION CONDYLOMA CERVICAL / VULVAR    . LASIK Bilateral   . LUMBAR DISC SURGERY  2011   L4-5 ; Dr Shellia Carwin  . POLYPECTOMY    . VITRECTOMY Left 01-2015   Social History   Socioeconomic History  . Marital status: Married    Spouse name: Not on file  . Number of children: 2  . Years of education: 42  . Highest education level: Not on file  Occupational History  . Occupation: retired Print production planner  Tobacco Use  . Smoking status: Never Smoker  . Smokeless tobacco: Never Used  Substance and Sexual Activity  . Alcohol use: No    Alcohol/week: 0.0 standard drinks  . Drug use: No  . Sexual activity: Not on file  Other Topics Concern  . Not on file  Social History Narrative   Lives with husband in a 2 story home.  Has 2 children and 3 grandchildren.  Retired Print production planner.  Education: college degree.    Social Determinants of Health   Financial Resource Strain:   . Difficulty of Paying Living Expenses:   Food Insecurity:   .  Worried About Charity fundraiser in the Last Year:   . Arboriculturist in the Last Year:   Transportation Needs:   . Film/video editor (Medical):   Marland Kitchen Lack of Transportation (Non-Medical):   Physical Activity:   . Days of Exercise per Week:   . Minutes of Exercise per Session:   Stress:   . Feeling of Stress :   Social Connections:   . Frequency of Communication with Friends and Family:   . Frequency of Social Gatherings with Friends and Family:   . Attends Religious Services:   . Active Member of Clubs or Organizations:   . Attends Archivist Meetings:   Marland Kitchen Marital Status:    Allergies  Allergen Reactions  . Atorvastatin     Muscle  pain  . Crestor [Rosuvastatin Calcium]   . Lisinopril Cough   Family History  Problem Relation Age of Onset  . Thyroid cancer Mother   . Hypertension Mother   . Heart murmur Mother 15       unknown valve replaced  . Colon polyps Father   . Hyperlipidemia Father   . Atrial fibrillation Father        has PPM  . CAD Father 30       stent placed  . Diabetes Paternal Uncle   . Diabetes Paternal Grandmother   . Heart attack Paternal Grandmother        <65  . Stroke Paternal Grandfather   . Hypertension Sister   . Hyperlipidemia Sister   . Muscular dystrophy Cousin   . Colon cancer Neg Hx   . Esophageal cancer Neg Hx   . Rectal cancer Neg Hx   . Stomach cancer Neg Hx      Current Outpatient Medications (Cardiovascular):  .  hydrochlorothiazide (HYDRODIURIL) 25 MG tablet, TAKE 1 TABLET(25 MG) BY MOUTH DAILY .  losartan (COZAAR) 100 MG tablet, Take 1 tablet (100 mg total) by mouth daily. .  pravastatin (PRAVACHOL) 10 MG tablet, TAKE 1 TABLET(10 MG) BY MOUTH 3 TIMES A WEEK   Current Outpatient Medications (Analgesics):  .  aspirin (ASPIRIN EC) 81 MG EC tablet, Take 81 mg by mouth daily. Swallow whole. .  celecoxib (CELEBREX) 200 MG capsule, Take 200 mg by mouth 2 (two) times daily.  .  traMADol (ULTRAM) 50 MG tablet, Take 50 mg by mouth every 6 (six) hours as needed.   Current Outpatient Medications (Other):  .  calcium carbonate (TUMS - DOSED IN MG ELEMENTAL CALCIUM) 500 MG chewable tablet, Chew 1 tablet by mouth as needed for indigestion or heartburn. .  Magnesium 250 MG TABS, Take 400 mg by mouth daily.  .  Multiple Vitamin (MULTI VITAMIN DAILY PO), Take by mouth. .  Omega-3 Fatty Acids (FISH OIL) 1200 MG CPDR, Take 1,200 mg by mouth daily. .  SOOLANTRA 1 % CREA, APP AA ON THE FACE QD .  Vitamin D, Cholecalciferol, 1000 UNITS TABS, Take 5,000 Units by mouth daily.    Reviewed prior external information including notes and imaging from  primary care provider As well as  notes that were available from care everywhere and other healthcare systems.  Past medical history, social, surgical and family history all reviewed in electronic medical record.  No pertanent information unless stated regarding to the chief complaint.   Review of Systems:  No headache, visual changes, nausea, vomiting, diarrhea, constipation, dizziness, abdominal pain, skin rash, fevers, chills, night sweats, weight loss, swollen lymph nodes, body aches,  joint swelling, chest pain, shortness of breath, mood changes. POSITIVE muscle aches  Objective  Blood pressure (!) 146/90, pulse 87, height 5\' 7"  (1.702 m), weight 230 lb (104.3 kg), last menstrual period 09/30/2014, SpO2 96 %.   General: No apparent distress alert and oriented x3 mood and affect normal, dressed appropriately.  Gait is still antalgic with a left-sided foot drop for which the AFO seems to do relatively well without out swinging her hip as much.  Right hand exam shows the patient has a negative Finkelstein's.  Very mild pain with CMC grind test.  Neurovascularly intact.  Continues to have the proximal weakness of all musculature.   Impression and Recommendations:     The above documentation has been reviewed and is accurate and complete Lyndal Pulley, DO       Note: This dictation was prepared with Dragon dictation along with smaller phrase technology. Any transcriptional errors that result from this process are unintentional.

## 2019-08-29 NOTE — Assessment & Plan Note (Signed)
Improvement noted with the AFO.  Patient does need to get used to it.  We discussed other issues that could be potentially beneficial.  Follow-up again as needed

## 2019-08-29 NOTE — Assessment & Plan Note (Signed)
Continue brace at night if it is beneficial otherwise can stop time as well.  Patient should do well long-term

## 2020-04-25 ENCOUNTER — Other Ambulatory Visit: Payer: Self-pay | Admitting: Internal Medicine

## 2020-04-28 NOTE — Patient Instructions (Signed)
Blood work was ordered.     prevnar pneumonia vaccine today.   Medications changes include :   none    Please followup in 1 year     Health Maintenance, Female Adopting a healthy lifestyle and getting preventive care are important in promoting health and wellness. Ask your health care provider about:  The right schedule for you to have regular tests and exams.  Things you can do on your own to prevent diseases and keep yourself healthy. What should I know about diet, weight, and exercise? Eat a healthy diet  Eat a diet that includes plenty of vegetables, fruits, low-fat dairy products, and lean protein.  Do not eat a lot of foods that are high in solid fats, added sugars, or sodium.   Maintain a healthy weight Body mass index (BMI) is used to identify weight problems. It estimates body fat based on height and weight. Your health care provider can help determine your BMI and help you achieve or maintain a healthy weight. Get regular exercise Get regular exercise. This is one of the most important things you can do for your health. Most adults should:  Exercise for at least 150 minutes each week. The exercise should increase your heart rate and make you sweat (moderate-intensity exercise).  Do strengthening exercises at least twice a week. This is in addition to the moderate-intensity exercise.  Spend less time sitting. Even light physical activity can be beneficial. Watch cholesterol and blood lipids Have your blood tested for lipids and cholesterol at 66 years of age, then have this test every 5 years. Have your cholesterol levels checked more often if:  Your lipid or cholesterol levels are high.  You are older than 66 years of age.  You are at high risk for heart disease. What should I know about cancer screening? Depending on your health history and family history, you may need to have cancer screening at various ages. This may include screening for:  Breast  cancer.  Cervical cancer.  Colorectal cancer.  Skin cancer.  Lung cancer. What should I know about heart disease, diabetes, and high blood pressure? Blood pressure and heart disease  High blood pressure causes heart disease and increases the risk of stroke. This is more likely to develop in people who have high blood pressure readings, are of African descent, or are overweight.  Have your blood pressure checked: ? Every 3-5 years if you are 50-53 years of age. ? Every year if you are 44 years old or older. Diabetes Have regular diabetes screenings. This checks your fasting blood sugar level. Have the screening done:  Once every three years after age 72 if you are at a normal weight and have a low risk for diabetes.  More often and at a younger age if you are overweight or have a high risk for diabetes. What should I know about preventing infection? Hepatitis B If you have a higher risk for hepatitis B, you should be screened for this virus. Talk with your health care provider to find out if you are at risk for hepatitis B infection. Hepatitis C Testing is recommended for:  Everyone born from [birth year] through 1966.  Anyone with known risk factors for hepatitis C. Sexually transmitted infections (STIs)  Get screened for STIs, including gonorrhea and chlamydia, if: ? You are sexually active and are younger than 66 years of age. ? You are older than 66 years of age and your health care provider tells you that you are  at risk for this type of infection. ? Your sexual activity has changed since you were last screened, and you are at increased risk for chlamydia or gonorrhea. Ask your health care provider if you are at risk.  Ask your health care provider about whether you are at high risk for HIV. Your health care provider may recommend a prescription medicine to help prevent HIV infection. If you choose to take medicine to prevent HIV, you should first get tested for HIV. You should  then be tested every 3 months for as long as you are taking the medicine. Pregnancy  If you are about to stop having your period (premenopausal) and you may become pregnant, seek counseling before you get pregnant.  Take 400 to 800 micrograms (mcg) of folic acid every day if you become pregnant.  Ask for birth control (contraception) if you want to prevent pregnancy. Osteoporosis and menopause Osteoporosis is a disease in which the bones lose minerals and strength with aging. This can result in bone fractures. If you are 84 years old or older, or if you are at risk for osteoporosis and fractures, ask your health care provider if you should:  Be screened for bone loss.  Take a calcium or vitamin D supplement to lower your risk of fractures.  Be given hormone replacement therapy (HRT) to treat symptoms of menopause. Follow these instructions at home: Lifestyle  Do not use any products that contain nicotine or tobacco, such as cigarettes, e-cigarettes, and chewing tobacco. If you need help quitting, ask your health care provider.  Do not use street drugs.  Do not share needles.  Ask your health care provider for help if you need support or information about quitting drugs. Alcohol use  Do not drink alcohol if: ? Your health care provider tells you not to drink. ? You are pregnant, may be pregnant, or are planning to become pregnant.  If you drink alcohol: ? Limit how much you use to 0-1 drink a day. ? Limit intake if you are breastfeeding.  Be aware of how much alcohol is in your drink. In the [place] one drink equals one 12 oz bottle of beer (355 mL), one 5 oz glass of wine (148 mL), or one 1 oz glass of hard liquor (44 mL). General instructions  Schedule regular health, dental, and eye exams.  Stay current with your vaccines.  Tell your health care provider if: ? You often feel depressed. ? You have ever been abused or do not feel safe at home. Summary  Adopting a  healthy lifestyle and getting preventive care are important in promoting health and wellness.  Follow your health care provider's instructions about healthy diet, exercising, and getting tested or screened for diseases.  Follow your health care provider's instructions on monitoring your cholesterol and blood pressure. This information is not intended to replace advice given to you by your health care provider. Make sure you discuss any questions you have with your health care provider. Document Revised: 03/01/2018 Document Reviewed: 03/01/2018 Elsevier Patient Education  2021 [employer].

## 2020-04-28 NOTE — Progress Notes (Unsigned)
Subjective:    Patient ID: Lisa [name], female    DOB: 22-Feb-1956, 65 y.o.   MRN: [id number]   This visit occurred during the SARS-CoV-2 public health emergency.  Safety protocols were in place, including screening questions prior to the visit, additional usage of staff PPE, and extensive cleaning of exam room while observing appropriate contact time as indicated for disinfecting solutions.    HPI She is here for a physical exam.   She is walking less and always using a walker.  Sometimes she uses it as a wheelchair and her husband pushes her.    She has a lot of mouth, eyes and skin dryness.  This seems different.  Her eye doctor put her on restasis.  Her dentist advised gel for her mouth. Her mouth is extremely dry.  She does not drink as much as she should.  She started using a humidifier.     She is having headaches - it is located in the frontal region.  She has never had headaches.  Her prescription has not changed much so it is not her eyes.  No obvious triggers.  Sometimes wakes up with a headache.  Occur randomly.  Gets them 2-3 times a week.  She takes tramadol and tylenol regularly and that does help.  She typically takes this 0-1 time a day.    It could be stress.  Her father has dementia and that is stressful.  She denies depression.  She worries a lot.    Medications and allergies reviewed with patient and updated if appropriate.  Patient Active Problem List   Diagnosis Date Noted  . De Quervain's tenosynovitis, right 07/18/2019  . Arthritis of carpometacarpal [facility] joint of right thumb 06/08/2019  . Vitamin D deficiency 04/26/2019  . Fatigue 04/26/2019  . Degenerative arthritis of left knee 07/05/2018  . Urinary urgency 04/24/2018  . Carotid artery disease (HCC) 10/27/2017  . Subclavian steal syndrome 10/27/2017  . Jaw pain 10/27/2017  . Elevated LFTs 07/18/2017  . Psoriasis 07/06/2017  . Fascioscapulohumeral muscular dystrophy (HCC) 04/21/2017  .  Prediabetes 04/28/2016  . Hepatitis C antibody test positive - false positive 04/28/2016  . Left hip pain 04/23/2016  . Right arm pain 04/23/2016  . Arthralgia 04/23/2016  . TMJ arthralgia 05/22/2015  . Hyperlipidemia 12/19/2014  . Essential hypertension 12/19/2014  . History of colonic polyps 12/19/2014  . Left lumbar radiculopathy 05/27/2009  . Left foot drop 05/27/2009    Current Outpatient Medications on File Prior to Visit  Medication Sig Dispense Refill  . aspirin 81 MG EC tablet Take 81 mg by mouth daily. Swallow whole.    . calcium carbonate (TUMS - DOSED IN MG ELEMENTAL CALCIUM) 500 MG chewable tablet Chew 1 tablet by mouth as needed for indigestion or heartburn.    . celecoxib (CELEBREX) 200 MG capsule Take 200 mg by mouth 2 (two) times daily.     . hydrochlorothiazide (HYDRODIURIL) 25 MG tablet TAKE 1 TABLET(25 MG) BY MOUTH DAILY 90 tablet 3  . losartan (COZAAR) 100 MG tablet Take 1 tablet (100 mg total) by mouth daily. 90 tablet 3  . Magnesium 250 MG TABS Take 250 mg by mouth daily.    . Multiple Vitamin (MULTI VITAMIN DAILY PO) Take by mouth.    . Omega-3 Fatty Acids (FISH OIL) 1200 MG CPDR Take 1,200 mg by mouth daily.    . pravastatin (PRAVACHOL) 10 MG tablet TAKE 1 TABLET(10 MG) BY MOUTH 3 TIMES A WEEK 45  tablet 3  . RESTASIS 0.05 % ophthalmic emulsion 1 drop 2 (two) times daily.    . traMADol (ULTRAM) 50 MG tablet Take 50 mg by mouth every 6 (six) hours as needed.    . Vitamin D, Cholecalciferol, 1000 UNITS TABS Take 2,000 Units by mouth as needed. Patient takes Vitamin D 3 times per week    . [DISCONTINUED] losartan-hydrochlorothiazide (HYZAAR) 50-12.5 MG tablet Take 1 tablet by mouth daily. 90 tablet 3   No current facility-administered medications on file prior to visit.    Past Medical History:  Diagnosis Date  . Cataract    removed from both eyes  . GERD (gastroesophageal reflux disease)    no issue while on tums  . Heart murmur   . Heavy menses     irregular; on Provera  . Hyperlipidemia    diet controlled  . Hypertension   . Muscular dystrophy, facioscapulohumeral [facility])     Past Surgical History:  Procedure Laterality Date  . CATARACT EXTRACTION Bilateral   . CESAREAN SECTION     X2  . COLONOSCOPY  2008   [facility]  . LASER ABLATION CONDYLOMA CERVICAL / VULVAR    . LASIK Bilateral   . LUMBAR DISC SURGERY  2011   L4-5 ; Dr Shellia Carwin  . POLYPECTOMY    . VITRECTOMY Left 01-2015    Social History   Socioeconomic History  . Marital status: Married    Spouse name: Not on file  . Number of children: 2  . Years of education: 11  . Highest education level: Not on file  Occupational History  . Occupation: retired Print production planner  Tobacco Use  . Smoking status: Never Smoker  . Smokeless tobacco: Never Used  Vaping Use  . Vaping Use: Never used  Substance and Sexual Activity  . Alcohol use: No    Alcohol/week: 0.0 standard drinks  . Drug use: No  . Sexual activity: Not on file  Other Topics Concern  . Not on file  Social History Narrative   Lives with husband in a 2 story home.  Has 2 children and 3 grandchildren.  Retired Print production planner.  Education: college degree.    Social Determinants of Health   Financial Resource Strain: Not on file  Food Insecurity: Not on file  Transportation Needs: Not on file  Physical Activity: Not on file  Stress: Not on file  Social Connections: Not on file    Family History  Problem Relation Age of Onset  . Thyroid cancer Mother   . Hypertension Mother   . Heart murmur Mother 87       unknown valve replaced  . Colon polyps Father   . Hyperlipidemia Father   . Atrial fibrillation Father        has PPM  . CAD Father 16       stent placed  . Diabetes Paternal Uncle   . Diabetes Paternal Grandmother   . Heart attack Paternal Grandmother        <65  . Stroke Paternal Grandfather   . Hypertension Sister   . Hyperlipidemia Sister   . Muscular dystrophy Cousin   .  Colon cancer Neg Hx   . Esophageal cancer Neg Hx   . Rectal cancer Neg Hx   . Stomach cancer Neg Hx     Review of Systems  Constitutional: Negative for chills and fever.  Eyes: Negative for visual disturbance.  Respiratory: Positive for cough (related to dry mouth) and shortness of breath (  related to deconditioning). Negative for wheezing.   Cardiovascular: Positive for palpitations (very occ) and leg swelling (occ - evelates legs). Negative for chest pain.  Gastrointestinal: Positive for constipation (stool osftener, mag). Negative for abdominal pain, blood in stool, diarrhea and nausea.       Occ gerd  Genitourinary: Negative for dysuria and hematuria.  Musculoskeletal: Positive for arthralgias. Negative for back pain.  Skin: Negative for rash.  Neurological: Positive for headaches. Negative for dizziness. Light-headedness: rare with standing.  Psychiatric/Behavioral: Positive for dysphoric mood. The patient is nervous/anxious.        Objective:   Vitals:   04/29/20 0859  BP: 136/78  Pulse: 96  Temp: 98.2 F (36.8 C)  SpO2: 96%   Filed Weights   04/29/20 0859  Weight: 230 lb (104.3 kg)   Body mass index is 36.02 kg/m.  BP Readings from Last 3 Encounters:  04/29/20 136/78  08/29/19 (!) 146/90  07/18/19 116/78    Wt Readings from Last 3 Encounters:  04/29/20 230 lb (104.3 kg)  08/29/19 230 lb (104.3 kg)  07/18/19 230 lb (104.3 kg)    Depression screen [facility] 2/9 04/29/2020 04/26/2019 04/24/2018 04/22/2017  Decreased Interest 0 0 0 0  Down, Depressed, Hopeless 1 - 0 0  PHQ - 2 Score 1 0 0 0  Altered sleeping 1 - - -  Tired, decreased energy 1 - - -  Change in appetite 1 - - -  Feeling bad or failure about yourself  1 - - -  Trouble concentrating 1 - - -  Moving slowly or fidgety/restless 0 - - -  Suicidal thoughts 0 - - -  PHQ-9 Score 6 - - -  Difficult doing work/chores Somewhat difficult - - -      Physical Exam Constitutional: She appears well-developed and  well-nourished. No distress.  HENT:  Head: Normocephalic and atraumatic.  Right Ear: External ear normal. Normal ear canal and TM Left Ear: External ear normal.  Normal ear canal and TM Mouth/Throat: Oropharynx is clear and moist.  Eyes: Conjunctivae and EOM are normal.  Neck: Neck supple. No tracheal deviation present. No thyromegaly present.  No carotid bruit  Cardiovascular: Normal rate, regular rhythm and normal heart sounds.   No murmur heard.  No edema. Pulmonary/Chest: Effort normal and breath sounds normal. No respiratory distress. She has no wheezes. She has no rales.  Breast: deferred   Abdominal: Soft. She exhibits no distension. There is no tenderness.  Lymphadenopathy: She has no cervical adenopathy.  Skin: Skin is warm and dry. She is not diaphoretic.  Psychiatric: She has a normal mood and affect. Her behavior is normal.        Assessment & Plan:   Physical exam: Screening blood work    ordered Immunizations  prevnar today, Discussed PNA vaccines and shingrix Colonoscopy  Due - will schedule Mammogram  Due this year Gyn  Up to date  Dexa  Deferred - may try to get a gyn office Eye exams  Up to date  Exercise   - not able Weight  obese Substance abuse  none   Depression screening: Reviewed PHQ-9 questionnaire with her.  We discussed some of her positive answers and she feels those are related to her muscular dystrophy.  She denies any depression, but admits she feels down at times but nothing consistently.  She defers needing any medication or therapy at this time.   See Problem List for Assessment and Plan of chronic medical problems.

## 2020-04-29 ENCOUNTER — Ambulatory Visit (INDEPENDENT_AMBULATORY_CARE_PROVIDER_SITE_OTHER): Payer: Medicare Other | Admitting: Internal Medicine

## 2020-04-29 ENCOUNTER — Encounter: Payer: Self-pay | Admitting: Internal Medicine

## 2020-04-29 ENCOUNTER — Other Ambulatory Visit: Payer: Self-pay

## 2020-04-29 VITALS — BP 136/78 | HR 96 | Temp 98.2°F | Ht 67.0 in | Wt 230.0 lb

## 2020-04-29 DIAGNOSIS — R7303 Prediabetes: Secondary | ICD-10-CM | POA: Diagnosis not present

## 2020-04-29 DIAGNOSIS — Z0000 Encounter for general adult medical examination without abnormal findings: Secondary | ICD-10-CM

## 2020-04-29 DIAGNOSIS — I10 Essential (primary) hypertension: Secondary | ICD-10-CM | POA: Diagnosis not present

## 2020-04-29 DIAGNOSIS — E7849 Other hyperlipidemia: Secondary | ICD-10-CM

## 2020-04-29 DIAGNOSIS — G7102 Facioscapulohumeral muscular dystrophy: Secondary | ICD-10-CM

## 2020-04-29 DIAGNOSIS — M3500 Sicca syndrome, unspecified: Secondary | ICD-10-CM

## 2020-04-29 DIAGNOSIS — Z23 Encounter for immunization: Secondary | ICD-10-CM

## 2020-04-29 LAB — COMPREHENSIVE METABOLIC PANEL
ALT: 44 U/L — ABNORMAL HIGH (ref 0–35)
AST: 21 U/L (ref 0–37)
Albumin: 4.2 g/dL (ref 3.5–5.2)
Alkaline Phosphatase: 88 U/L (ref 39–117)
BUN: 19 mg/dL (ref 6–23)
CO2: 33 mEq/L — ABNORMAL HIGH (ref 19–32)
Calcium: 10.2 mg/dL (ref 8.4–10.5)
Chloride: 99 mEq/L (ref 96–112)
Creatinine, Ser: 0.63 mg/dL (ref 0.40–1.20)
GFR: 93.29 mL/min (ref >60.00)
Glucose, Bld: 100 mg/dL — ABNORMAL HIGH (ref 70–99)
Potassium: 4 mEq/L (ref 3.5–5.1)
Sodium: 138 mEq/L (ref 135–145)
Total Bilirubin: 0.5 mg/dL (ref 0.2–1.2)
Total Protein: 7.4 g/dL (ref 6.0–8.3)

## 2020-04-29 LAB — CBC WITH DIFFERENTIAL/PLATELET
Basophils Absolute: 0.1 10*3/uL (ref 0.0–0.1)
Basophils Relative: 0.6 % (ref 0.0–3.0)
Eosinophils Absolute: 0.3 10*3/uL (ref 0.0–0.7)
Eosinophils Relative: 2.9 % (ref 0.0–5.0)
HCT: 42.8 % (ref 36.0–46.0)
Hemoglobin: 14.4 g/dL (ref 12.0–15.0)
Lymphocytes Relative: 26.2 % (ref 12.0–46.0)
Lymphs Abs: 2.7 10*3/uL (ref 0.7–4.0)
MCHC: 33.6 g/dL (ref 30.0–36.0)
MCV: 89.9 fl (ref 78.0–100.0)
Monocytes Absolute: 0.9 10*3/uL (ref 0.1–1.0)
Monocytes Relative: 8.7 % (ref 3.0–12.0)
Neutro Abs: 6.3 10*3/uL (ref 1.4–7.7)
Neutrophils Relative %: 61.6 % (ref 43.0–77.0)
Platelets: 287 10*3/uL (ref 150.0–400.0)
RBC: 4.76 Mil/uL (ref 3.87–5.11)
RDW: 13.8 % (ref 11.5–15.5)
WBC: 10.2 10*3/uL (ref 4.0–10.5)

## 2020-04-29 LAB — LIPID PANEL
Cholesterol: 248 mg/dL — ABNORMAL HIGH (ref 0–200)
HDL: 41.7 mg/dL (ref >39.00)
NonHDL: 206.39
Total CHOL/HDL Ratio: 6
Triglycerides: 248 mg/dL — ABNORMAL HIGH (ref 0.0–149.0)
VLDL: 49.6 mg/dL — ABNORMAL HIGH (ref 0.0–40.0)

## 2020-04-29 LAB — LDL CHOLESTEROL, DIRECT: Direct LDL: 160 mg/dL

## 2020-04-29 LAB — HEMOGLOBIN A1C: Hgb A1c MFr Bld: 5.8 % (ref 4.6–6.5)

## 2020-04-29 LAB — TSH: TSH: 2.26 u[IU]/mL (ref 0.35–4.50)

## 2020-04-29 NOTE — Assessment & Plan Note (Signed)
Chronic BP well controlled Continue hydrochlorothiazide 25 mg daily, losartan 100 mg daily cmp

## 2020-04-29 NOTE — Assessment & Plan Note (Signed)
Chronic Has progressed.  She states she is walking less and uses the walker and sometimes uses as a wheelchair Primarily left-sided weakness Following with pain management

## 2020-04-29 NOTE — Assessment & Plan Note (Signed)
Chronic Check lipid panel, CMP Continue pravastatin 10 mg 3 times a week healthy diet encouraged She is as active as possible

## 2020-04-29 NOTE — Assessment & Plan Note (Signed)
Chronic Check A1c Low sugar/carbohydrate diet She is trying to maintain as much activity as possible

## 2020-04-30 LAB — SJOGRENS SYNDROME-A EXTRACTABLE NUCLEAR ANTIBODY: SSA (Ro) (ENA) Antibody, IgG: <1 AI

## 2020-04-30 LAB — SJOGRENS SYNDROME-B EXTRACTABLE NUCLEAR ANTIBODY: SSB (La) (ENA) Antibody, IgG: <1 AI

## 2020-05-05 ENCOUNTER — Other Ambulatory Visit: Payer: Self-pay | Admitting: Internal Medicine

## 2020-05-10 ENCOUNTER — Other Ambulatory Visit: Payer: Self-pay | Admitting: Internal Medicine

## 2021-04-18 ENCOUNTER — Other Ambulatory Visit: Payer: Self-pay | Admitting: Internal Medicine

## 2021-04-30 ENCOUNTER — Encounter: Payer: Self-pay | Admitting: Internal Medicine

## 2021-04-30 NOTE — Progress Notes (Signed)
Subjective:    Patient ID: Lisa [name], female    DOB: 09/05/1955, 66 y.o.   MRN: [id number]   This visit occurred during the SARS-CoV-2 public health emergency.  Safety protocols were in place, including screening questions prior to the visit, additional usage of staff PPE, and extensive cleaning of exam room while observing appropriate contact time as indicated for disinfecting solutions.    HPI She is here for a physical exam.     Developed a cough - bad up until 2 weeks ago.  It did get better for unknown reasons.  But what was happening and was once she starts coughing she coughs for a long time and lasts until she can not cough anymore.   It is a dry cough.  She has always had a little bit of a cough - started last fall when the heat was turned on and initially she thought that was the cause but it happened everywhere.  Cough seems to be triggered by a tickle in the back of her throat.  She clears her throat often because of a tickle.  She does have a dry mouth and uses lozenges for the dry mouth.  Had covid in June of last year.  Lost smell for a while.    Skin is very dry - more than winter dryness.  Nails getting ridges.     Harder to walk-her muscular dystrophy is progressing.  She cannot stand for long parts of time and cannot walk long distances.  She has to have her walker and can no longer use just a cane.  Has episodes of being hot/cold - temperature is all out of wack.  The other day her legs were cold and upper body was hot.     Medications and allergies reviewed with patient and updated if appropriate.  Patient Active Problem List   Diagnosis Date Noted   Impaired regulation of body temperature 05/01/2021   Numbness and tingling in left arm 05/01/2021   Chronic cough 05/01/2021   De Quervain's tenosynovitis, right 07/18/2019   Arthritis of carpometacarpal [facility]) joint of right thumb 06/08/2019   Vitamin D deficiency 04/26/2019   Fatigue 04/26/2019    Degenerative arthritis of left knee 07/05/2018   Urinary urgency 04/24/2018   Carotid artery disease ([place]) 10/27/2017   Subclavian steal syndrome 10/27/2017   Elevated LFTs 07/18/2017   Psoriasis 07/06/2017   Fascioscapulohumeral muscular dystrophy ([place]) 04/21/2017   Prediabetes 04/28/2016   Hepatitis C antibody test positive - false positive 04/28/2016   Left hip pain 04/23/2016   Right arm pain 04/23/2016   Arthralgia 04/23/2016   TMJ arthralgia 05/22/2015   Hyperlipidemia 12/19/2014   Essential hypertension 12/19/2014   History of colon polyps 12/19/2014   Left lumbar radiculopathy 05/27/2009   Left foot drop 05/27/2009    Current Outpatient Medications on File Prior to Visit  Medication Sig Dispense Refill   aspirin 81 MG EC tablet Take 81 mg by mouth daily. Swallow whole.     calcium carbonate (TUMS - DOSED IN MG ELEMENTAL CALCIUM) 500 MG chewable tablet Chew 1 tablet by mouth as needed for indigestion or heartburn.     celecoxib (CELEBREX) 200 MG capsule Take 200 mg by mouth 2 (two) times daily.      hydrochlorothiazide (HYDRODIURIL) 25 MG tablet TAKE 1 TABLET(25 MG) BY MOUTH DAILY 90 tablet 3   Magnesium 250 MG TABS Take 250 mg by mouth daily.     Multiple Vitamin (MULTI VITAMIN DAILY  PO) Take by mouth.     Omega-3 Fatty Acids (FISH OIL) 1200 MG CPDR Take 1,200 mg by mouth daily.     RESTASIS 0.05 % ophthalmic emulsion 1 drop 2 (two) times daily.     traMADol (ULTRAM) 50 MG tablet Take 50 mg by mouth every 6 (six) hours as needed.     Vitamin D, Cholecalciferol, 1000 UNITS TABS Take 2,000 Units by mouth as needed. Patient takes Vitamin D 3 times per week     [DISCONTINUED] losartan-hydrochlorothiazide (HYZAAR) 50-12.5 MG tablet Take 1 tablet by mouth daily. 90 tablet 3   No current facility-administered medications on file prior to visit.    Past Medical History:  Diagnosis Date   Cataract    removed from both eyes   GERD (gastroesophageal reflux disease)    no issue  while on tums   Heart murmur    Heavy menses    irregular; on Provera   Hyperlipidemia    diet controlled   Hypertension    Muscular dystrophy, facioscapulohumeral ([place])     Past Surgical History:  Procedure Laterality Date   CATARACT EXTRACTION Bilateral    CESAREAN SECTION     X2   COLONOSCOPY  2008   [facility]   LASER ABLATION CONDYLOMA CERVICAL / VULVAR     LASIK Bilateral    LUMBAR [place] SURGERY  2011   L4-5 ; Dr Shellia Carwin   POLYPECTOMY     VITRECTOMY Left 01-2015    Social History   Socioeconomic History   Marital status: Married    Spouse name: Not on file   Number of children: 2   Years of education: 16   Highest education level: Not on file  Occupational History   Occupation: retired Print production planner  Tobacco Use   Smoking status: Never   Smokeless tobacco: Never  Vaping Use   Vaping Use: Never used  Substance and Sexual Activity   Alcohol use: No    Alcohol/week: 0.0 standard drinks   Drug use: No   Sexual activity: Not on file  Other Topics Concern   Not on file  Social History Narrative   Lives with husband in a 2 story home.  Has 2 children and 3 grandchildren.  Retired Print production planner.  Education: college degree.    Social Determinants of Health   Financial Resource Strain: Not on file  Food Insecurity: Not on file  Transportation Needs: Not on file  Physical Activity: Not on file  Stress: Not on file  Social Connections: Not on file    Family History  Problem Relation Age of Onset   Thyroid cancer Mother    Hypertension Mother    Heart murmur Mother 15       unknown valve replaced   Colon polyps Father    Hyperlipidemia Father    Atrial fibrillation Father        has PPM   CAD Father 66       stent placed   Diabetes Paternal Uncle    Diabetes Paternal Grandmother    Heart attack Paternal Grandmother        <65   Stroke Paternal Grandfather    Hypertension Sister    Hyperlipidemia Sister    Muscular dystrophy Cousin     Colon cancer Neg Hx    Esophageal cancer Neg Hx    Rectal cancer Neg Hx    Stomach cancer Neg Hx     Review of Systems  Constitutional:  Negative for chills  and fever.  HENT:  Negative for postnasal drip.   Eyes:  Negative for visual disturbance.  Respiratory:  Positive for cough. Negative for shortness of breath and wheezing.   Cardiovascular:  Positive for leg swelling. Negative for chest pain and palpitations.  Gastrointestinal:  Positive for constipation. Negative for abdominal pain, blood in stool, diarrhea and nausea.       No gerd  Endocrine: Positive for cold intolerance and heat intolerance.  Genitourinary:  Negative for dysuria.  Musculoskeletal:  Positive for arthralgias (all joints) and back pain. Negative for myalgias.  Skin:  Negative for rash.       Dry skin, nail ridges  Neurological:  Positive for light-headedness (rare), numbness (intermittent numbness in arm) and headaches (occ).  Psychiatric/Behavioral:  Negative for dysphoric mood. The patient is not nervous/anxious.       Objective:   Vitals:   05/01/21 0925  BP: 118/82  Pulse: 91  Temp: 98.4 F (36.9 C)  SpO2: 94%   Filed Weights   05/01/21 0925  Weight: 230 lb 4 oz (104.4 kg)   Body mass index is 36.06 kg/m.  BP Readings from Last 3 Encounters:  05/01/21 118/82  04/29/20 136/78  08/29/19 (!) 146/90    Wt Readings from Last 3 Encounters:  05/01/21 230 lb 4 oz (104.4 kg)  04/29/20 230 lb (104.3 kg)  08/29/19 230 lb (104.3 kg)    Depression screen [facility] 2/9 05/01/2021 04/29/2020 04/26/2019 04/24/2018 04/22/2017  Decreased Interest 1 0 0 0 0  Down, Depressed, Hopeless 1 1 - 0 0  PHQ - 2 Score 2 1 0 0 0  Altered sleeping 0 1 - - -  Tired, decreased energy 3 1 - - -  Change in appetite 0 1 - - -  Feeling bad or failure about yourself  0 1 - - -  Trouble concentrating 1 1 - - -  Moving slowly or fidgety/restless 0 0 - - -  Suicidal thoughts 0 0 - - -  PHQ-9 Score 6 6 - - -  Difficult doing  work/chores - Somewhat difficult - - -     No flowsheet data found.     Physical Exam Constitutional: She appears well-developed and well-nourished. No distress.  HENT:  Head: Normocephalic and atraumatic.  Right Ear: External ear normal. Normal ear canal and TM Left Ear: External ear normal.  Normal ear canal and TM Mouth/Throat: Oropharynx is clear and moist.  Eyes: Conjunctivae and EOM are normal.  Neck: Neck supple. No tracheal deviation present. No thyromegaly present. No carotid bruit  Cardiovascular: Normal rate, regular rhythm and normal heart sounds.   No murmur heard.  Trace bilateral lower extremity edema. Pulmonary/Chest: Effort normal and breath sounds normal. No respiratory distress. She has no wheezes. She has no rales.  Breast: deferred   Abdominal: Soft. She exhibits no distension. There is no tenderness.  Lymphadenopathy: She has no cervical adenopathy.  Skin: Skin is warm and dry. She is not diaphoretic.  Psychiatric: She has a normal mood and affect. Her behavior is normal.     Lab Results  Component Value Date   WBC 10.2 04/29/2020   HGB 14.4 04/29/2020   HCT 42.8 04/29/2020   PLT 287.0 04/29/2020   GLUCOSE 100 (H) 04/29/2020   CHOL 248 (H) 04/29/2020   TRIG 248.0 (H) 04/29/2020   HDL 41.70 04/29/2020   LDLDIRECT 160.0 04/29/2020   LDLCALC 90 10/27/2017   ALT 44 (H) 04/29/2020   AST 21 04/29/2020  NA 138 04/29/2020   K 4.0 04/29/2020   CL 99 04/29/2020   CREATININE 0.63 04/29/2020   BUN 19 04/29/2020   CO2 33 (H) 04/29/2020   TSH 2.26 04/29/2020   HGBA1C 5.8 04/29/2020         Assessment & Plan:   Physical exam: Screening blood work  ordered Exercise none Weight would benefit from weight loss, but she has not been able to-she knows she can do better with her diet and will work on it Substance abuse  none   Reviewed recommended immunizations.   Health Maintenance  Topic Date Due   COLONOSCOPY (Pts 45-44yrs Insurance coverage  will need to be confirmed)  03/04/2018   MAMMOGRAM  01/05/2020   Pneumonia Vaccine 76+ Years old (2 - PPSV23 if available, else PCV20) 04/29/2021   COVID-19 Vaccine (4 - Booster for Moderna series) 05/17/2021 (Originally 03/14/2020)   Zoster Vaccines- Shingrix (1 of 2) 07/29/2021 (Originally 03/16/2005)   DEXA SCAN  05/01/2022 (Originally 03/16/2020)   TETANUS/TDAP  05/01/2022 (Originally 03/16/1974)   INFLUENZA VACCINE  Completed   Hepatitis C Screening  Completed   HPV VACCINES  Aged Out          See Problem List for Assessment and Plan of chronic medical problems.

## 2021-04-30 NOTE — Patient Instructions (Addendum)
Pneumovax given today.    Blood work was ordered.  A chest xray was ordered.    Medications changes include :   None  Your prescription(s) have been submitted to your pharmacy. Please take as directed and contact our office if you believe you are having problem(s) with the medication(s).   A referral was ordered for ENT.       Someone from their office will call you to schedule an appointment.    Please followup in 1 year   Health Maintenance, Female Adopting a healthy lifestyle and getting preventive care are important in promoting health and wellness. Ask your health care provider about: The right schedule for you to have regular tests and exams. Things you can do on your own to prevent diseases and keep yourself healthy. What should I know about diet, weight, and exercise? Eat a healthy diet  Eat a diet that includes plenty of vegetables, fruits, low-fat dairy products, and lean protein. Do not eat a lot of foods that are high in solid fats, added sugars, or sodium. Maintain a healthy weight Body mass index (BMI) is used to identify weight problems. It estimates body fat based on height and weight. Your health care provider can help determine your BMI and help you achieve or maintain a healthy weight. Get regular exercise Get regular exercise. This is one of the most important things you can do for your health. Most adults should: Exercise for at least 150 minutes each week. The exercise should increase your heart rate and make you sweat (moderate-intensity exercise). Do strengthening exercises at least twice a week. This is in addition to the moderate-intensity exercise. Spend less time sitting. Even light physical activity can be beneficial. Watch cholesterol and blood lipids Have your blood tested for lipids and cholesterol at 67 years of age, then have this test every 5 years. Have your cholesterol levels checked more often if: Your lipid or cholesterol levels are  high. You are older than 67 years of age. You are at high risk for heart disease. What should I know about cancer screening? Depending on your health history and family history, you may need to have cancer screening at various ages. This may include screening for: Breast cancer. Cervical cancer. Colorectal cancer. Skin cancer. Lung cancer. What should I know about heart disease, diabetes, and high blood pressure? Blood pressure and heart disease High blood pressure causes heart disease and increases the risk of stroke. This is more likely to develop in people who have high blood pressure readings or are overweight. Have your blood pressure checked: Every 3-5 years if you are 103-14 years of age. Every year if you are 80 years old or older. Diabetes Have regular diabetes screenings. This checks your fasting blood sugar level. Have the screening done: Once every three years after age 39 if you are at a normal weight and have a low risk for diabetes. More often and at a younger age if you are overweight or have a high risk for diabetes. What should I know about preventing infection? Hepatitis B If you have a higher risk for hepatitis B, you should be screened for this virus. Talk with your health care provider to find out if you are at risk for hepatitis B infection. Hepatitis C Testing is recommended for: Everyone born from [birth year] through 1966. Anyone with known risk factors for hepatitis C. Sexually transmitted infections (STIs) Get screened for STIs, including gonorrhea and chlamydia, if: You are sexually active  and are younger than 67 years of age. You are older than 67 years of age and your health care provider tells you that you are at risk for this type of infection. Your sexual activity has changed since you were last screened, and you are at increased risk for chlamydia or gonorrhea. Ask your health care provider if you are at risk. Ask your health care provider about whether you  are at high risk for HIV. Your health care provider may recommend a prescription medicine to help prevent HIV infection. If you choose to take medicine to prevent HIV, you should first get tested for HIV. You should then be tested every 3 months for as long as you are taking the medicine. Pregnancy If you are about to stop having your period (premenopausal) and you may become pregnant, seek counseling before you get pregnant. Take 400 to 800 micrograms (mcg) of folic acid every day if you become pregnant. Ask for birth control (contraception) if you want to prevent pregnancy. Osteoporosis and menopause Osteoporosis is a disease in which the bones lose minerals and strength with aging. This can result in bone fractures. If you are 37 years old or older, or if you are at risk for osteoporosis and fractures, ask your health care provider if you should: Be screened for bone loss. Take a calcium or vitamin D supplement to lower your risk of fractures. Be given hormone replacement therapy (HRT) to treat symptoms of menopause. Follow these instructions at home: Alcohol use Do not drink alcohol if: Your health care provider tells you not to drink. You are pregnant, may be pregnant, or are planning to become pregnant. If you drink alcohol: Limit how much you have to: 0-1 drink a day. Know how much alcohol is in your drink. In the [place] one drink equals one 12 oz bottle of beer (355 mL), one 5 oz glass of wine (148 mL), or one 1 oz glass of hard liquor (44 mL). Lifestyle Do not use any products that contain nicotine or tobacco. These products include cigarettes, chewing tobacco, and vaping devices, such as e-cigarettes. If you need help quitting, ask your health care provider. Do not use street drugs. Do not share needles. Ask your health care provider for help if you need support or information about quitting drugs. General instructions Schedule regular health, dental, and eye exams. Stay current  with your vaccines. Tell your health care provider if: You often feel depressed. You have ever been abused or do not feel safe at home. Summary Adopting a healthy lifestyle and getting preventive care are important in promoting health and wellness. Follow your health care provider's instructions about healthy diet, exercising, and getting tested or screened for diseases. Follow your health care provider's instructions on monitoring your cholesterol and blood pressure. This information is not intended to replace advice given to you by your health care provider. Make sure you discuss any questions you have with your health care provider. Document Revised: 07/28/2020 Document Reviewed: 07/28/2020 Elsevier Patient Education  [employer].

## 2021-05-01 ENCOUNTER — Other Ambulatory Visit: Payer: Self-pay

## 2021-05-01 ENCOUNTER — Ambulatory Visit (INDEPENDENT_AMBULATORY_CARE_PROVIDER_SITE_OTHER): Payer: Medicare Other

## 2021-05-01 ENCOUNTER — Ambulatory Visit (INDEPENDENT_AMBULATORY_CARE_PROVIDER_SITE_OTHER): Payer: Medicare Other | Admitting: Internal Medicine

## 2021-05-01 VITALS — BP 118/82 | HR 91 | Temp 98.4°F | Ht 67.0 in | Wt 230.2 lb

## 2021-05-01 DIAGNOSIS — I10 Essential (primary) hypertension: Secondary | ICD-10-CM

## 2021-05-01 DIAGNOSIS — Z86010 Personal history of colonic polyps: Secondary | ICD-10-CM

## 2021-05-01 DIAGNOSIS — R7303 Prediabetes: Secondary | ICD-10-CM

## 2021-05-01 DIAGNOSIS — R053 Chronic cough: Secondary | ICD-10-CM

## 2021-05-01 DIAGNOSIS — G7102 Facioscapulohumeral muscular dystrophy: Secondary | ICD-10-CM | POA: Diagnosis not present

## 2021-05-01 DIAGNOSIS — Z23 Encounter for immunization: Secondary | ICD-10-CM | POA: Diagnosis not present

## 2021-05-01 DIAGNOSIS — R200 Anesthesia of skin: Secondary | ICD-10-CM

## 2021-05-01 DIAGNOSIS — R6889 Other general symptoms and signs: Secondary | ICD-10-CM

## 2021-05-01 DIAGNOSIS — Z0000 Encounter for general adult medical examination without abnormal findings: Secondary | ICD-10-CM | POA: Diagnosis not present

## 2021-05-01 DIAGNOSIS — R202 Paresthesia of skin: Secondary | ICD-10-CM | POA: Diagnosis not present

## 2021-05-01 DIAGNOSIS — E7849 Other hyperlipidemia: Secondary | ICD-10-CM | POA: Diagnosis not present

## 2021-05-01 DIAGNOSIS — E559 Vitamin D deficiency, unspecified: Secondary | ICD-10-CM

## 2021-05-01 LAB — T3, FREE: T3, Free: 3.1 pg/mL (ref 2.3–4.2)

## 2021-05-01 LAB — COMPREHENSIVE METABOLIC PANEL
ALT: 26 U/L (ref 0–35)
AST: 20 U/L (ref 0–37)
Albumin: 4.3 g/dL (ref 3.5–5.2)
Alkaline Phosphatase: 83 U/L (ref 39–117)
BUN: 13 mg/dL (ref 6–23)
CO2: 33 mEq/L — ABNORMAL HIGH (ref 19–32)
Calcium: 10.1 mg/dL (ref 8.4–10.5)
Chloride: 100 mEq/L (ref 96–112)
Creatinine, Ser: 0.57 mg/dL (ref 0.40–1.20)
GFR: 94.89 mL/min (ref >60.00)
Glucose, Bld: 111 mg/dL — ABNORMAL HIGH (ref 70–99)
Potassium: 3.7 mEq/L (ref 3.5–5.1)
Sodium: 140 mEq/L (ref 135–145)
Total Bilirubin: 0.5 mg/dL (ref 0.2–1.2)
Total Protein: 7.7 g/dL (ref 6.0–8.3)

## 2021-05-01 LAB — HEMOGLOBIN A1C: Hgb A1c MFr Bld: 5.7 % (ref 4.6–6.5)

## 2021-05-01 LAB — CBC WITH DIFFERENTIAL/PLATELET
Basophils Absolute: 0 10*3/uL (ref 0.0–0.1)
Basophils Relative: 0.4 % (ref 0.0–3.0)
Eosinophils Absolute: 0.2 10*3/uL (ref 0.0–0.7)
Eosinophils Relative: 1.9 % (ref 0.0–5.0)
HCT: 43.4 % (ref 36.0–46.0)
Hemoglobin: 14.6 g/dL (ref 12.0–15.0)
Lymphocytes Relative: 22.8 % (ref 12.0–46.0)
Lymphs Abs: 2.1 10*3/uL (ref 0.7–4.0)
MCHC: 33.6 g/dL (ref 30.0–36.0)
MCV: 89.8 fl (ref 78.0–100.0)
Monocytes Absolute: 0.7 10*3/uL (ref 0.1–1.0)
Monocytes Relative: 7.9 % (ref 3.0–12.0)
Neutro Abs: 6.3 10*3/uL (ref 1.4–7.7)
Neutrophils Relative %: 67 % (ref 43.0–77.0)
Platelets: 281 10*3/uL (ref 150.0–400.0)
RBC: 4.84 Mil/uL (ref 3.87–5.11)
RDW: 13.8 % (ref 11.5–15.5)
WBC: 9.4 10*3/uL (ref 4.0–10.5)

## 2021-05-01 LAB — VITAMIN D 25 HYDROXY (VIT D DEFICIENCY, FRACTURES): VITD: 43.74 ng/mL (ref 30.00–100.00)

## 2021-05-01 LAB — VITAMIN B12: Vitamin B-12: 444 pg/mL (ref 211–911)

## 2021-05-01 LAB — LIPID PANEL
Cholesterol: 243 mg/dL — ABNORMAL HIGH (ref 0–200)
HDL: 40.9 mg/dL (ref >39.00)
NonHDL: 202.33
Total CHOL/HDL Ratio: 6
Triglycerides: 202 mg/dL — ABNORMAL HIGH (ref 0.0–149.0)
VLDL: 40.4 mg/dL — ABNORMAL HIGH (ref 0.0–40.0)

## 2021-05-01 LAB — T4, FREE: Free T4: 0.89 ng/dL (ref 0.60–1.60)

## 2021-05-01 LAB — MAGNESIUM: Magnesium: 2 mg/dL (ref 1.5–2.5)

## 2021-05-01 LAB — TSH: TSH: 1.05 u[IU]/mL (ref 0.35–5.50)

## 2021-05-01 LAB — LDL CHOLESTEROL, DIRECT: Direct LDL: 172 mg/dL

## 2021-05-01 MED ORDER — PRAVASTATIN SODIUM 10 MG PO TABS
ORAL_TABLET | ORAL | 3 refills | Status: DC
Start: 2021-05-01 — End: 2022-05-04

## 2021-05-01 MED ORDER — LOSARTAN POTASSIUM 100 MG PO TABS: ORAL_TABLET | ORAL | 3 refills | Status: DC

## 2021-05-01 NOTE — Assessment & Plan Note (Signed)
Chronic cough-this started a while ago-better for the past 2 weeks for some reason, but severe prior to that.  She would have coughing episodes that would last a long time and she was unable to cough anymore Triggered by a tickle in the back of her throat Denies PND, GERD Does have a dry throat and uses lozenges often.  Does drink a lot of fluids Discussed possible causes including dryness, PND, GERD, losartan She did not want to change losartan at this time We will get a chest x-ray today Refer to ENT I do wonder if she has silent GERD

## 2021-05-01 NOTE — Assessment & Plan Note (Signed)
New She states being hot and cold symptoms at the same time ?  Related to thyroid-we will check TFTs ?  Dysautonomia Consider referral to endocrine

## 2021-05-01 NOTE — Assessment & Plan Note (Signed)
Chronic Taking vitamin D daily Check vitamin D level  

## 2021-05-01 NOTE — Assessment & Plan Note (Signed)
Chronic, intermittent Check B12 level

## 2021-05-01 NOTE — Assessment & Plan Note (Signed)
Chronic Check a1c Low sugar / carb diet 

## 2021-05-01 NOTE — Assessment & Plan Note (Signed)
Chronic Blood pressure well controlled CMP Continue HCTZ 25 mg daily, losartan 100 mg daily 

## 2021-05-01 NOTE — Assessment & Plan Note (Addendum)
Chronic Regular exercise and healthy diet encouraged Check lipid panel, CMP, TSH Continue pravastatin 10 mg 3 times a week-she is concerned whether the pravastatin is bad for her muscular dystrophy or not-she is not sure does not have anyone that she can ask-we will look into finding a specialist with her particular muscular dystrophy, but for now we will continue the medication

## 2021-05-01 NOTE — Assessment & Plan Note (Signed)
Chronic Overdue for colonoscopy-she is reluctant to have it because she knows it will exacerbate her muscular dystrophy Encouraged her to talk to Dr. Kathryne Sharper about the colonoscopy and whether or not she should have it-referral ordered

## 2021-05-01 NOTE — Assessment & Plan Note (Signed)
Chronic No longer following with anyone-has not been able to find someone who specializes in this type of muscular dystrophy.  She knows she needs to try to find someone to help guide her-she does have questions about certain things There has been some progression since she was here last year-not able to stand as long and not able to walk .  She also needs a walker to ambulate-a cane is no longer enough She will look into a specialist

## 2021-05-04 NOTE — Addendum Note (Signed)
Addended by: Marcina Millard on: 05/04/2021 01:12 PM   Modules accepted: Orders

## 2021-05-28 ENCOUNTER — Encounter: Payer: Self-pay | Admitting: Internal Medicine

## 2021-06-10 ENCOUNTER — Telehealth: Payer: Self-pay | Admitting: Internal Medicine

## 2021-06-10 NOTE — Telephone Encounter (Signed)
Form completed and left up front for pick up. ? ?Patient notified ?

## 2021-06-10 NOTE — Telephone Encounter (Signed)
Pt checking status of form for handicap sticker, I did not locate a note regarding the form being dropped off ? ?Pt states form was dropped of 3-20, informed pt she will receive a call once form is completed ? ?

## 2021-07-06 ENCOUNTER — Ambulatory Visit (INDEPENDENT_AMBULATORY_CARE_PROVIDER_SITE_OTHER): Payer: Medicare Other

## 2021-07-06 DIAGNOSIS — Z0000 Encounter for general adult medical examination without abnormal findings: Secondary | ICD-10-CM | POA: Diagnosis not present

## 2021-07-06 NOTE — Progress Notes (Signed)
?I connected with Lisa [name] today by telephone and verified that I am speaking with the correct person using two identifiers. ?Location patient: home ?Location provider: work ?Persons participating in the virtual visit: patient, provider. ?  ?I discussed the limitations, risks, security and privacy concerns of performing an evaluation and management service by telephone and the availability of in person appointments. I also discussed with the patient that there may be a patient responsible charge related to this service. The patient expressed understanding and verbally consented to this telephonic visit.  ?  ?Interactive audio and video telecommunications were attempted between this provider and patient, however failed, due to patient having technical difficulties OR patient did not have access to video capability.  We continued and completed visit with audio only. ? ?Some vital signs may be absent or patient reported.  ? ?Time Spent with patient on telephone encounter: 30 minutes ? ?Subjective:  ? Lisa [name] is a 66 y.o. female who presents for Medicare Annual (Subsequent) preventive examination. ? ?Review of Systems    ? ?Cardiac Risk Factors include: advanced age (>76mn, >>50women);dyslipidemia;hypertension;family history of premature cardiovascular disease;obesity (BMI >30kg/m2) ? ?   ?Objective:  ?  ?There were no vitals filed for this visit. ?There is no height or weight on file to calculate BMI. ? ? ?  07/06/2021  ? 10:04 AM 05/04/2016  ?  9:32 AM 04/30/2016  ?  9:31 AM 02/19/2015  ?  1:27 PM  ?Advanced Directives  ?Does Patient Have a Medical Advance Directive? Yes No No No  ?Type of Advance Directive Living will;Healthcare Power of Attorney     ?Does patient want to make changes to medical advance directive? No - Patient declined     ?Copy of HFultonin Chart? No - copy requested     ?Would patient like information on creating a medical advance directive?  No - Patient  declined    ? ? ?Current Medications (verified) ?Outpatient Encounter Medications as of 07/06/2021  ?Medication Sig  ? aspirin 81 MG EC tablet Take 81 mg by mouth daily. Swallow whole.  ? calcium carbonate (TUMS - DOSED IN MG ELEMENTAL CALCIUM) 500 MG chewable tablet Chew 1 tablet by mouth as needed for indigestion or heartburn.  ? celecoxib (CELEBREX) 200 MG capsule Take 200 mg by mouth 2 (two) times daily.   ? hydrochlorothiazide (HYDRODIURIL) 25 MG tablet TAKE 1 TABLET(25 MG) BY MOUTH DAILY  ? losartan (COZAAR) 100 MG tablet TAKE 1 TABLET(100 MG) BY MOUTH DAILY  ? Magnesium 250 MG TABS Take 250 mg by mouth daily.  ? Multiple Vitamin (MULTI VITAMIN DAILY PO) Take by mouth.  ? Omega-3 Fatty Acids (FISH OIL) 1200 MG CPDR Take 1,200 mg by mouth daily.  ? pravastatin (PRAVACHOL) 10 MG tablet TAKE 1 TABLET BY MOUTH THREE TIMES A WEEK  ? RESTASIS 0.05 % ophthalmic emulsion 1 drop 2 (two) times daily.  ? traMADol (ULTRAM) 50 MG tablet Take 50 mg by mouth every 6 (six) hours as needed.  ? Vitamin D, Cholecalciferol, 1000 UNITS TABS Take 2,000 Units by mouth as needed. Patient takes Vitamin D 3 times per week  ? [DISCONTINUED] losartan-hydrochlorothiazide (HYZAAR) 50-12.5 MG tablet Take 1 tablet by mouth daily.  ? ?No facility-administered encounter medications on file as of 07/06/2021.  ? ? ?Allergies (verified) ?Atorvastatin, Crestor [rosuvastatin calcium], and Lisinopril  ? ?History: ?Past Medical History:  ?Diagnosis Date  ? Cataract   ? removed from both eyes  ? GERD (  gastroesophageal reflux disease)   ? no issue while on tums  ? Heart murmur   ? Heavy menses   ? irregular; on Provera  ? Hyperlipidemia   ? diet controlled  ? Hypertension   ? Muscular dystrophy, facioscapulohumeral ([place])   ? ?Past Surgical History:  ?Procedure Laterality Date  ? CATARACT EXTRACTION Bilateral   ? CESAREAN SECTION    ? X2  ? COLONOSCOPY  2008  ? [facility]  ? LASER ABLATION CONDYLOMA CERVICAL / VULVAR    ? LASIK Bilateral   ? [place]  [facility]  2011  ? L4-5 ; Dr Shellia Carwin  ? POLYPECTOMY    ? VITRECTOMY Left 01-2015  ? ?Family History  ?Problem Relation Age of Onset  ? Thyroid cancer Mother   ? Hypertension Mother   ? Heart murmur Mother 74  ?     unknown valve replaced  ? Colon polyps Father   ? Hyperlipidemia Father   ? Atrial fibrillation Father   ?     has PPM  ? CAD Father 59  ?     stent placed  ? Diabetes Paternal Uncle   ? Diabetes Paternal Grandmother   ? Heart attack Paternal Grandmother   ?     <65  ? Stroke Paternal Grandfather   ? Hypertension Sister   ? Hyperlipidemia Sister   ? Muscular dystrophy Cousin   ? Colon cancer Neg Hx   ? Esophageal cancer Neg Hx   ? Rectal cancer Neg Hx   ? Stomach cancer Neg Hx   ? ?Social History  ? ?Socioeconomic History  ? Marital status: Married  ?  Spouse name: Not on file  ? Number of children: 2  ? Years of education: 62  ? Highest education level: Not on file  ?Occupational History  ? Occupation: retired Print production planner  ?Tobacco Use  ? Smoking status: Never  ? Smokeless tobacco: Never  ?Vaping Use  ? Vaping Use: Never used  ?Substance and Sexual Activity  ? Alcohol use: No  ?  Alcohol/week: 0.0 standard drinks  ? Drug use: No  ? Sexual activity: Not on file  ?Other Topics Concern  ? Not on file  ?Social History Narrative  ? Lives with husband in a 2 story home.  Has 2 children and 3 grandchildren.  Retired Print production planner.  Education: college degree.   ? ?Social Determinants of Health  ? ?Financial Resource Strain: Low Risk   ? Difficulty of Paying Living Expenses: Not hard at all  ?Food Insecurity: No Food Insecurity  ? Worried About Charity fundraiser in the Last Year: Never true  ? Ran Out of Food in the Last Year: Never true  ?Transportation Needs: No Transportation Needs  ? Lack of Transportation (Medical): No  ? Lack of Transportation (Non-Medical): No  ?Physical Activity: Inactive  ? Days of Exercise per Week: 0 days  ? Minutes of Exercise per Session: 0 min  ?Stress: No Stress Concern  Present  ? Feeling of Stress : Not at all  ?Social Connections: Socially Integrated  ? Frequency of Communication with Friends and Family: More than three times a week  ? Frequency of Social Gatherings with Friends and Family: More than three times a week  ? Attends Religious Services: 1 to 4 times per year  ? Active Member of Clubs or Organizations: Yes  ? Attends Archivist Meetings: 1 to 4 times per year  ? Marital Status: Married  ? ? ?Tobacco  Counseling ?Counseling given: Not Answered ? ? ?Clinical Intake: ? ?Pre-visit preparation completed: Yes ? ?Pain : No/denies pain ? ?  ? ?Nutritional Risks: None ?Diabetes: No ? ?How often do you need to have someone help you when you read instructions, pamphlets, or other written materials from your doctor or pharmacy?: 1 - Never ?What is the last grade level you completed in school?: Bachelor's Degree ? ?Diabetic? no ? ?Interpreter Needed?: No ? ?Information entered by :: Lisette Abu, LPN ? ? ?Activities of Daily Living ? ?  07/06/2021  ? 10:13 AM  ?In your present state of health, do you have any difficulty performing the following activities:  ?Hearing? 0  ?Vision? 0  ?Difficulty concentrating or making decisions? 0  ?Walking or climbing stairs? 1  ?Dressing or bathing? 0  ?Doing errands, shopping? 1  ?Preparing Food and eating ? N  ?Using the Toilet? N  ?In the past six months, have you accidently leaked urine? N  ?Do you have problems with loss of bowel control? N  ?Managing your Medications? N  ?Managing your Finances? N  ?Housekeeping or managing your Housekeeping? Y  ? ? ?Patient Care Team: ?Binnie Rail, MD as PCP - General (Internal Medicine) ? ?Indicate any recent Medical Services you may have received from other than [facility] providers in the past year (date may be approximate). ? ?   ?Assessment:  ? This is a routine wellness examination for Lisa [name]. ? ?Hearing/Vision screen ?Hearing Screening - Comments:: Patient denied any hearing difficulty.    ?No hearing aids. ? ?Vision Screening - Comments:: Patient does not wear any corrective lenses/contacts.   ? ?Dietary issues and exercise activities discussed: ?Current Exercise Habits: The patient does not

## 2021-07-06 NOTE — Patient Instructions (Signed)
Lisa [name] , ?Thank you for taking time to come for your Medicare Wellness Visit. I appreciate your ongoing commitment to your health goals. Please review the following plan we discussed and let me know if I can assist you in the future.  ? ?Screening recommendations/referrals: ?Colonoscopy: 03/05/2015; due every 3 years ?Mammogram: 01/04/2018; due every 1-2 years ?Bone Density: never done ?Recommended yearly ophthalmology/optometry visit for glaucoma screening and checkup ?Recommended yearly dental visit for hygiene and checkup ? ?Vaccinations: ?Influenza vaccine: 01/07/2021 ?Pneumococcal vaccine: 04/29/2020, 05/01/2021 ?Tdap vaccine: never done ?Shingles vaccine: never done   ?Covid-19: 06/08/2019, 07/10/2019, 01/18/2020 ? ?Advanced directives: Yes ? ?Conditions/risks identified: Yes ? ?Next appointment: Please schedule your next Medicare Wellness Visit with your Nurse Health Advisor in 1 year by calling [phone]. ? ? ?Preventive Care 22 Years and Older, Female ?Preventive care refers to lifestyle choices and visits with your health care provider that can promote health and wellness. ?What does preventive care include? ?A yearly physical exam. This is also called an annual well check. ?Dental exams once or twice a year. ?Routine eye exams. Ask your health care provider how often you should have your eyes checked. ?Personal lifestyle choices, including: ?Daily care of your teeth and gums. ?Regular physical activity. ?Eating a healthy diet. ?Avoiding tobacco and drug use. ?Limiting alcohol use. ?Practicing safe sex. ?Taking low-dose aspirin every day. ?Taking vitamin and mineral supplements as recommended by your health care provider. ?What happens during an annual well check? ?The services and screenings done by your health care provider during your annual well check will depend on your age, overall health, lifestyle risk factors, and family history of disease. ?Counseling  ?Your health care provider may ask you  questions about your: ?Alcohol use. ?Tobacco use. ?Drug use. ?Emotional well-being. ?Home and relationship well-being. ?Sexual activity. ?Eating habits. ?History of falls. ?Memory and ability to understand (cognition). ?Work and work Statistician. ?Reproductive health. ?Screening  ?You may have the following tests or measurements: ?Height, weight, and BMI. ?Blood pressure. ?Lipid and cholesterol levels. These may be checked every 5 years, or more frequently if you are over 30 years old. ?Skin check. ?Lung cancer screening. You may have this screening every year starting at age 58 if you have a 30-pack-year history of smoking and currently smoke or have quit within the past 15 years. ?Fecal occult blood test (FOBT) of the stool. You may have this test every year starting at age 2. ?Flexible sigmoidoscopy or colonoscopy. You may have a sigmoidoscopy every 5 years or a colonoscopy every 10 years starting at age 15. ?Hepatitis C blood test. ?Hepatitis B blood test. ?Sexually transmitted disease (STD) testing. ?Diabetes screening. This is done by checking your blood sugar (glucose) after you have not eaten for a while (fasting). You may have this done every 1-3 years. ?Bone density scan. This is done to screen for osteoporosis. You may have this done starting at age 67. ?Mammogram. This may be done every 1-2 years. Talk to your health care provider about how often you should have regular mammograms. ?Talk with your health care provider about your test results, treatment options, and if necessary, the need for more tests. ?Vaccines  ?Your health care provider may recommend certain vaccines, such as: ?Influenza vaccine. This is recommended every year. ?Tetanus, diphtheria, and acellular pertussis (Tdap, Td) vaccine. You may need a Td booster every 10 years. ?Zoster vaccine. You may need this after age 34. ?Pneumococcal 13-valent conjugate (PCV13) vaccine. One dose is recommended after age 92. ?Pneumococcal  polysaccharide  (PPSV23) vaccine. One dose is recommended after age 34. ?Talk to your health care provider about which screenings and vaccines you need and how often you need them. ?This information is not intended to replace advice given to you by your health care provider. Make sure you discuss any questions you have with your health care provider. ?Document Released: 04/04/2015 Document Revised: 11/26/2015 Document Reviewed: 01/07/2015 ?Elsevier Interactive Patient Education ? 2017 [place]. ? ?Fall Prevention in the Home ?Falls can cause injuries. They can happen to people of all ages. There are many things you can do to make your home safe and to help prevent falls. ?What can I do on the outside of my home? ?Regularly fix the edges of walkways and driveways and fix any cracks. ?Remove anything that might make you trip as you walk through a door, such as a raised step or threshold. ?Trim any bushes or trees on the path to your home. ?Use bright outdoor lighting. ?Clear any walking paths of anything that might make someone trip, such as rocks or tools. ?Regularly check to see if handrails are loose or broken. Make sure that both sides of any steps have handrails. ?Any raised decks and porches should have guardrails on the edges. ?Have any leaves, snow, or ice cleared regularly. ?Use sand or salt on walking paths during winter. ?Clean up any spills in your garage right away. This includes oil or grease spills. ?What can I do in the bathroom? ?Use night lights. ?Install grab bars by the toilet and in the tub and shower. Do not use towel bars as grab bars. ?Use non-skid mats or decals in the tub or shower. ?If you need to sit down in the shower, use a plastic, non-slip stool. ?Keep the floor dry. Clean up any water that spills on the floor as soon as it happens. ?Remove soap buildup in the tub or shower regularly. ?Attach bath mats securely with double-sided non-slip rug tape. ?Do not have throw rugs and other things on the  floor that can make you trip. ?What can I do in the bedroom? ?Use night lights. ?Make sure that you have a light by your bed that is easy to reach. ?Do not use any sheets or blankets that are too big for your bed. They should not hang down onto the floor. ?Have a firm chair that has side arms. You can use this for support while you get dressed. ?Do not have throw rugs and other things on the floor that can make you trip. ?What can I do in the kitchen? ?Clean up any spills right away. ?Avoid walking on wet floors. ?Keep items that you use a lot in easy-to-reach places. ?If you need to reach something above you, use a strong step stool that has a grab bar. ?Keep electrical cords out of the way. ?Do not use floor polish or wax that makes floors slippery. If you must use wax, use non-skid floor wax. ?Do not have throw rugs and other things on the floor that can make you trip. ?What can I do with my stairs? ?Do not leave any items on the stairs. ?Make sure that there are handrails on both sides of the stairs and use them. Fix handrails that are broken or loose. Make sure that handrails are as long as the stairways. ?Check any carpeting to make sure that it is firmly attached to the stairs. Fix any carpet that is loose or worn. ?Avoid having throw rugs at the top  or bottom of the stairs. If you do have throw rugs, attach them to the floor with carpet tape. ?Make sure that you have a light switch at the top of the stairs and the bottom of the stairs. If you do not have them, ask someone to add them for you. ?What else can I do to help prevent falls? ?Wear shoes that: ?Do not have high heels. ?Have rubber bottoms. ?Are comfortable and fit you well. ?Are closed at the toe. Do not wear sandals. ?If you use a stepladder: ?Make sure that it is fully opened. Do not climb a closed stepladder. ?Make sure that both sides of the stepladder are locked into place. ?Ask someone to hold it for you, if possible. ?Clearly mark and make  sure that you can see: ?Any grab bars or handrails. ?First and last steps. ?Where the edge of each step is. ?Use tools that help you move around (mobility aids) if they are needed. These include: ?Canes. ?W

## 2021-10-23 ENCOUNTER — Other Ambulatory Visit: Payer: Self-pay | Admitting: Obstetrics and Gynecology

## 2021-10-23 DIAGNOSIS — Z1231 Encounter for screening mammogram for malignant neoplasm of breast: Secondary | ICD-10-CM

## 2021-11-11 ENCOUNTER — Ambulatory Visit
Admission: RE | Admit: 2021-11-11 | Discharge: 2021-11-11 | Disposition: A | Discharge status: Home / Self Care | Payer: Medicare Other | Source: Ambulatory Visit | Attending: Obstetrics and Gynecology | Admitting: Obstetrics and Gynecology

## 2021-11-11 DIAGNOSIS — Z1231 Encounter for screening mammogram for malignant neoplasm of breast: Secondary | ICD-10-CM

## 2021-12-01 LAB — COLOGUARD: Cologuard: NEGATIVE

## 2021-12-08 LAB — COLOGUARD: COLOGUARD: NEGATIVE

## 2022-02-05 ENCOUNTER — Encounter: Payer: Self-pay | Admitting: Internal Medicine

## 2022-02-05 DIAGNOSIS — G7102 Facioscapulohumeral muscular dystrophy: Secondary | ICD-10-CM

## 2022-02-19 ENCOUNTER — Encounter: Payer: Self-pay | Admitting: Internal Medicine

## 2022-02-25 ENCOUNTER — Encounter: Payer: Self-pay | Admitting: Internal Medicine

## 2022-02-25 NOTE — Progress Notes (Signed)
Outside notes received. Information abstracted. Notes sent to scan.  

## 2022-05-03 ENCOUNTER — Encounter: Payer: Self-pay | Admitting: Internal Medicine

## 2022-05-03 NOTE — Progress Notes (Unsigned)
Subjective:    Patient ID: Lisa [name], female    DOB: 1955/11/15, 67 y.o.   MRN: [id number]      HPI Lisa [name] is here for a Physical exam.   Has scooter, electric wheel, scooter.   - walking in home but not outside of home.   Off statin - should not be on this her muscular dystrophy.  2 days forgot to take am meds ( everything except the pain med) and felt better  - less fatigued - wondered if it was a side effect from a medication.   Medications and allergies reviewed with patient and updated if appropriate.  Current Outpatient Medications on File Prior to Visit  Medication Sig Dispense Refill   aspirin 81 MG EC tablet Take 81 mg by mouth daily. Swallow whole.     celecoxib (CELEBREX) 200 MG capsule Take 200 mg by mouth daily.     hydrochlorothiazide (HYDRODIURIL) 25 MG tablet TAKE 1 TABLET(25 MG) BY MOUTH DAILY 90 tablet 3   losartan (COZAAR) 100 MG tablet TAKE 1 TABLET(100 MG) BY MOUTH DAILY 90 tablet 3   Magnesium 250 MG TABS Take 250 mg by mouth 2 (two) times daily.     Multiple Vitamin (MULTI VITAMIN DAILY PO) Take by mouth.     Omega-3 Fatty Acids (FISH OIL) 1200 MG CPDR Take 1,200 mg by mouth daily.     traMADol (ULTRAM) 50 MG tablet Take 50 mg by mouth every 6 (six) hours as needed.     Vitamin D, Cholecalciferol, 1000 UNITS TABS Take 2,000 Units by mouth daily. Patient takes Vitamin D 3 times per week     [DISCONTINUED] losartan-hydrochlorothiazide (HYZAAR) 50-12.5 MG tablet Take 1 tablet by mouth daily. 90 tablet 3   No current facility-administered medications on file prior to visit.    Review of Systems  Constitutional:  Negative for fever.  HENT:  Positive for postnasal drip (sometimes).        Throat clearing - dry air, some PND  Eyes:  Negative for visual disturbance.  Respiratory:  Negative for cough, shortness of breath and wheezing.   Cardiovascular:  Positive for palpitations (transient thud - only occasional). Negative for chest pain and leg  swelling.  Gastrointestinal:  Positive for constipation (stool softener). Negative for abdominal pain, blood in stool and diarrhea.       Occ  gerd - food triggered  Genitourinary:  Negative for dysuria.  Musculoskeletal:  Positive for arthralgias (knees). Negative for back pain.  Skin:  Negative for rash.  Neurological:  Positive for numbness (fingers intermittently) and headaches (rare). Negative for dizziness and light-headedness.  Psychiatric/Behavioral:  Positive for sleep disturbance (RLS in legs). Negative for dysphoric mood. The patient is not nervous/anxious.        Objective:   Vitals:   05/04/22 0940  BP: 134/80  Pulse: 80  Temp: 98 F (36.7 C)  SpO2: 96%   Filed Weights   05/04/22 0940  Weight: 232 lb (105.2 kg)   Body mass index is 36.34 kg/m.  BP Readings from Last 3 Encounters:  05/04/22 134/80  05/01/21 118/82  04/29/20 136/78    Wt Readings from Last 3 Encounters:  05/04/22 232 lb (105.2 kg)  05/01/21 230 lb 4 oz (104.4 kg)  04/29/20 230 lb (104.3 kg)       Physical Exam Constitutional: She appears well-developed and well-nourished. No distress.  HENT:  Head: Normocephalic and atraumatic.  Right Ear: External ear normal. Normal ear canal and  TM Left Ear: External ear normal.  Normal ear canal and TM Mouth/Throat: Oropharynx is clear and moist.  Eyes: Conjunctivae normal.  Neck: Neck supple. No tracheal deviation present. No thyromegaly present.  No carotid bruit  Cardiovascular: Normal rate, regular rhythm and normal heart sounds.   No murmur heard.  No edema. Pulmonary/Chest: Effort normal and breath sounds normal. No respiratory distress. She has no wheezes. She has no rales.  Breast: deferred   Abdominal: Soft. She exhibits no distension. There is no tenderness.  Lymphadenopathy: She has no cervical adenopathy.  Skin: Skin is warm and dry. She is not diaphoretic.  Psychiatric: She has a normal mood and affect. Her behavior is normal.      Lab Results  Component Value Date   WBC 9.4 05/01/2021   HGB 14.6 05/01/2021   HCT 43.4 05/01/2021   PLT 281.0 05/01/2021   GLUCOSE 111 (H) 05/01/2021   CHOL 243 (H) 05/01/2021   TRIG 202.0 (H) 05/01/2021   HDL 40.90 05/01/2021   LDLDIRECT 172.0 05/01/2021   LDLCALC 90 10/27/2017   ALT 26 05/01/2021   AST 20 05/01/2021   NA 140 05/01/2021   K 3.7 05/01/2021   CL 100 05/01/2021   CREATININE 0.57 05/01/2021   BUN 13 05/01/2021   CO2 33 (H) 05/01/2021   TSH 1.05 05/01/2021   HGBA1C 5.7 05/01/2021         Assessment & Plan:   Physical exam: Screening blood work  ordered Exercise  not much Weight  obese Substance abuse  none   Reviewed recommended immunizations.   Health Maintenance  Topic Date Due   DTaP/Tdap/Td (1 - Tdap) Never done   COVID-19 Vaccine (4 - 2023-24 season) 11/20/2021   Zoster Vaccines- Shingrix (1 of 2) 08/02/2022 (Originally 03/16/2005)   DEXA SCAN  05/05/2023 (Originally 03/16/2020)   Medicare Annual Wellness (AWV)  07/07/2022   MAMMOGRAM  11/12/2023   Fecal DNA (Cologuard)  12/01/2024   Pneumonia Vaccine 9+ Years old  Completed   INFLUENZA VACCINE  Completed   Hepatitis C Screening  Completed   HPV VACCINES  Aged Out   COLONOSCOPY (Pts 45-64yr Insurance coverage will need to be confirmed)  Discontinued          See Problem List for Assessment and Plan of chronic medical problems.

## 2022-05-03 NOTE — Patient Instructions (Addendum)
For RLS - consider requip or mirapex    Blood work was ordered.   Have this done in about 6 weeks    Medications changes include :   zetia 10 mg daily for your cholesterol     Return in about 1 year (around 05/05/2023) for Physical Exam.   Health Maintenance, Female Adopting a healthy lifestyle and getting preventive care are important in promoting health and wellness. Ask your health care provider about: The right schedule for you to have regular tests and exams. Things you can do on your own to prevent diseases and keep yourself healthy. What should I know about diet, weight, and exercise? Eat a healthy diet  Eat a diet that includes plenty of vegetables, fruits, low-fat dairy products, and lean protein. Do not eat a lot of foods that are high in solid fats, added sugars, or sodium. Maintain a healthy weight Body mass index (BMI) is used to identify weight problems. It estimates body fat based on height and weight. Your health care provider can help determine your BMI and help you achieve or maintain a healthy weight. Get regular exercise Get regular exercise. This is one of the most important things you can do for your health. Most adults should: Exercise for at least 150 minutes each week. The exercise should increase your heart rate and make you sweat (moderate-intensity exercise). Do strengthening exercises at least twice a week. This is in addition to the moderate-intensity exercise. Spend less time sitting. Even light physical activity can be beneficial. Watch cholesterol and blood lipids Have your blood tested for lipids and cholesterol at 68 years of age, then have this test every 5 years. Have your cholesterol levels checked more often if: Your lipid or cholesterol levels are high. You are older than 68 years of age. You are at high risk for heart disease. What should I know about cancer screening? Depending on your health history and family history, you may  need to have cancer screening at various ages. This may include screening for: Breast cancer. Cervical cancer. Colorectal cancer. Skin cancer. Lung cancer. What should I know about heart disease, diabetes, and high blood pressure? Blood pressure and heart disease High blood pressure causes heart disease and increases the risk of stroke. This is more likely to develop in people who have high blood pressure readings or are overweight. Have your blood pressure checked: Every 3-5 years if you are 23-37 years of age. Every year if you are 56 years old or older. Diabetes Have regular diabetes screenings. This checks your fasting blood sugar level. Have the screening done: Once every three years after age 39 if you are at a normal weight and have a low risk for diabetes. More often and at a younger age if you are overweight or have a high risk for diabetes. What should I know about preventing infection? Hepatitis B If you have a higher risk for hepatitis B, you should be screened for this virus. Talk with your health care provider to find out if you are at risk for hepatitis B infection. Hepatitis C Testing is recommended for: Everyone born from [birth year] through 1965. Anyone with known risk factors for hepatitis C. Sexually transmitted infections (STIs) Get screened for STIs, including gonorrhea and chlamydia, if: You are sexually active and are younger than 68 years of age. You are older than 68 years of age and your health care provider tells you that you are at risk for this type of infection.  Your sexual activity has changed since you were last screened, and you are at increased risk for chlamydia or gonorrhea. Ask your health care provider if you are at risk. Ask your health care provider about whether you are at high risk for HIV. Your health care provider may recommend a prescription medicine to help prevent HIV infection. If you choose to take medicine to prevent HIV, you should first get  tested for HIV. You should then be tested every 3 months for as long as you are taking the medicine. Pregnancy If you are about to stop having your period (premenopausal) and you may become pregnant, seek counseling before you get pregnant. Take 400 to 800 micrograms (mcg) of folic acid every day if you become pregnant. Ask for birth control (contraception) if you want to prevent pregnancy. Osteoporosis and menopause Osteoporosis is a disease in which the bones lose minerals and strength with aging. This can result in bone fractures. If you are 55 years old or older, or if you are at risk for osteoporosis and fractures, ask your health care provider if you should: Be screened for bone loss. Take a calcium or vitamin D supplement to lower your risk of fractures. Be given hormone replacement therapy (HRT) to treat symptoms of menopause. Follow these instructions at home: Alcohol use Do not drink alcohol if: Your health care provider tells you not to drink. You are pregnant, may be pregnant, or are planning to become pregnant. If you drink alcohol: Limit how much you have to: 0-1 drink a day. Know how much alcohol is in your drink. In the [place] one drink equals one 12 oz bottle of beer (355 mL), one 5 oz glass of wine (148 mL), or one 1 oz glass of hard liquor (44 mL). Lifestyle Do not use any products that contain nicotine or tobacco. These products include cigarettes, chewing tobacco, and vaping devices, such as e-cigarettes. If you need help quitting, ask your health care provider. Do not use street drugs. Do not share needles. Ask your health care provider for help if you need support or information about quitting drugs. General instructions Schedule regular health, dental, and eye exams. Stay current with your vaccines. Tell your health care provider if: You often feel depressed. You have ever been abused or do not feel safe at home. Summary Adopting a healthy lifestyle and getting  preventive care are important in promoting health and wellness. Follow your health care provider's instructions about healthy diet, exercising, and getting tested or screened for diseases. Follow your health care provider's instructions on monitoring your cholesterol and blood pressure. This information is not intended to replace advice given to you by your health care provider. Make sure you discuss any questions you have with your health care provider. Document Revised: 07/28/2020 Document Reviewed: 07/28/2020 Elsevier Patient Education  [place].

## 2022-05-04 ENCOUNTER — Encounter: Payer: Self-pay | Admitting: Internal Medicine

## 2022-05-04 ENCOUNTER — Ambulatory Visit (INDEPENDENT_AMBULATORY_CARE_PROVIDER_SITE_OTHER): Payer: Medicare Other | Admitting: Internal Medicine

## 2022-05-04 VITALS — BP 134/80 | HR 80 | Temp 98.0°F | Ht 67.0 in | Wt 232.0 lb

## 2022-05-04 DIAGNOSIS — R5383 Other fatigue: Secondary | ICD-10-CM

## 2022-05-04 DIAGNOSIS — E7849 Other hyperlipidemia: Secondary | ICD-10-CM | POA: Diagnosis not present

## 2022-05-04 DIAGNOSIS — I10 Essential (primary) hypertension: Secondary | ICD-10-CM | POA: Diagnosis not present

## 2022-05-04 DIAGNOSIS — Z0000 Encounter for general adult medical examination without abnormal findings: Secondary | ICD-10-CM

## 2022-05-04 DIAGNOSIS — G7102 Facioscapulohumeral muscular dystrophy: Secondary | ICD-10-CM

## 2022-05-04 DIAGNOSIS — R7303 Prediabetes: Secondary | ICD-10-CM

## 2022-05-04 DIAGNOSIS — I6523 Occlusion and stenosis of bilateral carotid arteries: Secondary | ICD-10-CM

## 2022-05-04 DIAGNOSIS — E559 Vitamin D deficiency, unspecified: Secondary | ICD-10-CM

## 2022-05-04 MED ORDER — EZETIMIBE 10 MG PO TABS: 10.0000 mg | ORAL_TABLET | Freq: Every day | ORAL | 3 refills | Status: DC

## 2022-05-04 NOTE — Assessment & Plan Note (Signed)
Chronic Check a1c Low sugar / carb diet 

## 2022-05-04 NOTE — Assessment & Plan Note (Signed)
Noticed less fatigue when she did skip her a.m. medications which are her 2 blood pressure medications, Celebrex and vitamins ?  Side effect from 1 of those medications Will hold her vitamins for a few days to make sure it is not one of them which is less likely-if no change she will restart them She will try switching losartan to nighttime to see if that helps-if it does that could be part of the cause we may need to consider switching it Can you hold or adjust the other 2 medications if needed to try to identify if one of the medications are causing the side effect She will keep me up-to-date via MyChart

## 2022-05-04 NOTE — Assessment & Plan Note (Signed)
Chronic Taking vitamin D daily Check vitamin D level  

## 2022-05-04 NOTE — Assessment & Plan Note (Signed)
Chronic Blood pressure well controlled CMP Continue hydrochlorothiazide 25 mg daily, losartan 100 mg daily

## 2022-05-04 NOTE — Assessment & Plan Note (Addendum)
Chronic Mild Continue aspirin 81 mg daily Stopped pravastatin and should not be on statin due to her muscular dystrophy Start Zetia 10 mg daily

## 2022-05-04 NOTE — Assessment & Plan Note (Addendum)
Chronic Muscle weakness has progressed since her last visit mildly She walks around in the house, but does not walk outside the house Always uses walker inside the house Has a electric wheelchair and scooter

## 2022-05-04 NOTE — Assessment & Plan Note (Addendum)
Chronic Regular exercise and healthy diet encouraged Check lipid panel  Stopped pravastatin and should not be on statin due to her muscular dystrophy Start Zetia 10 mg daily If Zetia is not effective enough may need to consider Repatha

## 2022-05-13 ENCOUNTER — Other Ambulatory Visit: Payer: Self-pay | Admitting: Internal Medicine

## 2022-05-13 ENCOUNTER — Other Ambulatory Visit: Payer: Self-pay

## 2022-05-21 ENCOUNTER — Other Ambulatory Visit: Payer: Self-pay | Admitting: Internal Medicine

## 2022-06-30 ENCOUNTER — Other Ambulatory Visit (INDEPENDENT_AMBULATORY_CARE_PROVIDER_SITE_OTHER): Payer: Medicare Other

## 2022-06-30 DIAGNOSIS — R7303 Prediabetes: Secondary | ICD-10-CM

## 2022-06-30 DIAGNOSIS — I10 Essential (primary) hypertension: Secondary | ICD-10-CM

## 2022-06-30 DIAGNOSIS — E559 Vitamin D deficiency, unspecified: Secondary | ICD-10-CM

## 2022-06-30 DIAGNOSIS — E7849 Other hyperlipidemia: Secondary | ICD-10-CM

## 2022-06-30 LAB — COMPREHENSIVE METABOLIC PANEL
ALT: 26 U/L (ref 0–35)
AST: 19 U/L (ref 0–37)
Albumin: 4.2 g/dL (ref 3.5–5.2)
Alkaline Phosphatase: 73 U/L (ref 39–117)
BUN: 15 mg/dL (ref 6–23)
CO2: 29 mEq/L (ref 19–32)
Calcium: 9.8 mg/dL (ref 8.4–10.5)
Chloride: 101 mEq/L (ref 96–112)
Creatinine, Ser: 0.59 mg/dL (ref 0.40–1.20)
GFR: 93.34 mL/min (ref >60.00)
Glucose, Bld: 110 mg/dL — ABNORMAL HIGH (ref 70–99)
Potassium: 3.9 mEq/L (ref 3.5–5.1)
Sodium: 139 mEq/L (ref 135–145)
Total Bilirubin: 0.4 mg/dL (ref 0.2–1.2)
Total Protein: 7.1 g/dL (ref 6.0–8.3)

## 2022-06-30 LAB — CBC WITH DIFFERENTIAL/PLATELET
Basophils Absolute: 0 10*3/uL (ref 0.0–0.1)
Basophils Relative: 0.4 % (ref 0.0–3.0)
Eosinophils Absolute: 0.2 10*3/uL (ref 0.0–0.7)
Eosinophils Relative: 3 % (ref 0.0–5.0)
HCT: 42.8 % (ref 36.0–46.0)
Hemoglobin: 14.2 g/dL (ref 12.0–15.0)
Lymphocytes Relative: 30.9 % (ref 12.0–46.0)
Lymphs Abs: 2.2 10*3/uL (ref 0.7–4.0)
MCHC: 33.2 g/dL (ref 30.0–36.0)
MCV: 89.2 fl (ref 78.0–100.0)
Monocytes Absolute: 0.5 10*3/uL (ref 0.1–1.0)
Monocytes Relative: 7.7 % (ref 3.0–12.0)
Neutro Abs: 4.1 10*3/uL (ref 1.4–7.7)
Neutrophils Relative %: 58 % (ref 43.0–77.0)
Platelets: 262 10*3/uL (ref 150.0–400.0)
RBC: 4.8 Mil/uL (ref 3.87–5.11)
RDW: 14.1 % (ref 11.5–15.5)
WBC: 7 10*3/uL (ref 4.0–10.5)

## 2022-06-30 LAB — HEMOGLOBIN A1C: Hgb A1c MFr Bld: 5.8 % (ref 4.6–6.5)

## 2022-06-30 LAB — VITAMIN D 25 HYDROXY (VIT D DEFICIENCY, FRACTURES): VITD: 46.56 ng/mL (ref 30.00–100.00)

## 2022-06-30 LAB — LIPID PANEL
Cholesterol: 234 mg/dL — ABNORMAL HIGH (ref 0–200)
HDL: 39.3 mg/dL (ref >39.00)
NonHDL: 195.01
Total CHOL/HDL Ratio: 6
Triglycerides: 205 mg/dL — ABNORMAL HIGH (ref 0.0–149.0)
VLDL: 41 mg/dL — ABNORMAL HIGH (ref 0.0–40.0)

## 2022-06-30 LAB — TSH: TSH: 1.87 u[IU]/mL (ref 0.35–5.50)

## 2022-06-30 LAB — LDL CHOLESTEROL, DIRECT: Direct LDL: 156 mg/dL

## 2022-07-15 ENCOUNTER — Telehealth: Payer: Self-pay | Admitting: Internal Medicine

## 2022-07-15 NOTE — Telephone Encounter (Signed)
Contacted Lisa [name] to schedule their annual wellness visit. Appointment made for 07/26/2022.  [facility] Care Guide [facility] AWV TEAM Direct Dial: [phone]

## 2022-07-26 ENCOUNTER — Ambulatory Visit (INDEPENDENT_AMBULATORY_CARE_PROVIDER_SITE_OTHER): Payer: Medicare Other

## 2022-07-26 VITALS — Ht 67.0 in | Wt 232.0 lb

## 2022-07-26 DIAGNOSIS — Z0000 Encounter for general adult medical examination without abnormal findings: Secondary | ICD-10-CM

## 2022-07-26 NOTE — Progress Notes (Addendum)
I connected with  Lisa [name] on 07/26/22 by a audio enabled telemedicine application and verified that I am speaking with the correct person using two identifiers.  Patient Location: Home  Provider Location: Office/Clinic  I discussed the limitations of evaluation and management by telemedicine. The patient expressed understanding and agreed to proceed.  Patient Medicare AWV questionnaire was completed by the patient on 07/22/2022; I have confirmed that all information answered by patient is correct and no changes since this date.    Subjective:   Lisa [name] is a 67 y.o. female who presents for Medicare Annual (Subsequent) preventive examination.  Review of Systems     Cardiac Risk Factors include: advanced age (>49men, >57 women);hypertension;family history of premature cardiovascular disease;dyslipidemia;obesity (BMI >30kg/m2);sedentary lifestyle     Objective:    Today's Vitals   07/26/22 1615 07/26/22 1617  Weight: 232 lb (105.2 kg)   Height: 5\' 7"  (1.702 m)   PainSc: 5  5    Body mass index is 36.34 kg/m.     07/26/2022    4:21 PM 07/06/2021   10:04 AM 05/04/2016    9:32 AM 04/30/2016    9:31 AM 02/19/2015    1:27 PM  Advanced Directives  Does Patient Have a Medical Advance Directive? Yes Yes No No No  Type of Estate agent of [place];Living will Living will;Healthcare Power of Attorney     Does patient want to make changes to medical advance directive?  No - Patient declined     Copy of Healthcare Power of Attorney in Chart? No - copy requested No - copy requested     Would patient like information on creating a medical advance directive?   No - Patient declined      Current Medications (verified) Outpatient Encounter Medications as of 07/26/2022  Medication Sig   hydrochlorothiazide (HYDRODIURIL) 25 MG tablet TAKE 1 TABLET(25 MG) BY MOUTH DAILY   aspirin 81 MG EC tablet Take 81 mg by mouth daily. Swallow whole.   celecoxib  (CELEBREX) 200 MG capsule Take 200 mg by mouth daily.   ezetimibe (ZETIA) 10 MG tablet Take 1 tablet (10 mg total) by mouth daily.   losartan (COZAAR) 100 MG tablet TAKE 1 TABLET(100 MG) BY MOUTH DAILY   Magnesium 250 MG TABS Take 250 mg by mouth 2 (two) times daily.   Multiple Vitamin (MULTI VITAMIN DAILY PO) Take by mouth.   Omega-3 Fatty Acids (FISH OIL) 1200 MG CPDR Take 1,200 mg by mouth daily.   traMADol (ULTRAM) 50 MG tablet Take 50 mg by mouth every 6 (six) hours as needed.   Vitamin D, Cholecalciferol, 1000 UNITS TABS Take 2,000 Units by mouth daily. Patient takes Vitamin D 3 times per week   [DISCONTINUED] losartan-hydrochlorothiazide (HYZAAR) 50-12.5 MG tablet Take 1 tablet by mouth daily.   No facility-administered encounter medications on file as of 07/26/2022.    Allergies (verified) Atorvastatin, Crestor [rosuvastatin calcium], and Lisinopril   History: Past Medical History:  Diagnosis Date   Cataract    removed from both eyes   GERD (gastroesophageal reflux disease)    no issue while on tums   Heart murmur    Heavy menses    irregular; on Provera   Hyperlipidemia    diet controlled   Hypertension    Muscular dystrophy, facioscapulohumeral (HCC)    Past Surgical History:  Procedure Laterality Date   CATARACT EXTRACTION Bilateral    CESAREAN SECTION     X2   COLONOSCOPY  2008   [facility]   LASER ABLATION CONDYLOMA CERVICAL / VULVAR     LASIK Bilateral    LUMBAR DISC SURGERY  2011   L4-5 ; Dr Simonne Come   POLYPECTOMY     VITRECTOMY Left 01-2015   Family History  Problem Relation Age of Onset   Thyroid cancer Mother    Hypertension Mother    Heart murmur Mother 45       unknown valve replaced   Colon polyps Father    Hyperlipidemia Father    Atrial fibrillation Father        has PPM   CAD Father 91       stent placed   Diabetes Paternal Uncle    Diabetes Paternal Grandmother    Heart attack Paternal Grandmother        <65   Stroke Paternal  Grandfather    Hypertension Sister    Hyperlipidemia Sister    Muscular dystrophy Cousin    Colon cancer Neg Hx    Esophageal cancer Neg Hx    Rectal cancer Neg Hx    Stomach cancer Neg Hx    Social History   Socioeconomic History   Marital status: Married    Spouse name: Not on file   Number of children: 2   Years of education: 16   Highest education level: Not on file  Occupational History   Occupation: retired Manufacturing systems engineer  Tobacco Use   Smoking status: Never   Smokeless tobacco: Never  Vaping Use   Vaping Use: Never used  Substance and Sexual Activity   Alcohol use: No    Alcohol/week: 0.0 standard drinks of alcohol   Drug use: No   Sexual activity: Not on file  Other Topics Concern   Not on file  Social History Narrative   ** Merged History Encounter **       Lives with husband in a 2 story home.  Has 2 children and 3 grandchildren.  Retired Manufacturing systems engineer.  Education: college degree.    Social Determinants of Health   Financial Resource Strain: Low Risk  (07/26/2022)   Overall Financial Resource Strain (CARDIA)    Difficulty of Paying Living Expenses: Not hard at all  Food Insecurity: No Food Insecurity (07/26/2022)   Hunger Vital Sign    Worried About Running Out of Food in the Last Year: Never true    Ran Out of Food in the Last Year: Never true  Transportation Needs: No Transportation Needs (07/26/2022)   PRAPARE - Administrator, Civil Service (Medical): No    Lack of Transportation (Non-Medical): No  Physical Activity: Inactive (07/26/2022)   Exercise Vital Sign    Days of Exercise per Week: 0 days    Minutes of Exercise per Session: 0 min  Stress: No Stress Concern Present (07/26/2022)   [employer] of Occupational Health - Occupational Stress Questionnaire    Feeling of Stress : Only a little  Social Connections: Unknown (07/26/2022)   Social Connection and Isolation Panel [NHANES]    Frequency of Communication with Friends and  Family: More than three times a week    Frequency of Social Gatherings with Friends and Family: Three times a week    Attends Religious Services: Not on file    Active Member of Clubs or Organizations: Yes    Attends Club or Organization Meetings: More than 4 times per year    Marital Status: Married    Tobacco Counseling Counseling given: Not Answered  Clinical Intake:  Pre-visit preparation completed: Yes  Pain : 0-10 Pain Score: 5  Pain Type: Chronic pain Pain Location: Generalized (Muscular Dystrophy) Pain Radiating Towards: Depends on the flare-up: hips, knees, shoulders, elbows, wrists     BMI - recorded: 36.34 Nutritional Status: BMI > 30  Obese Nutritional Risks: None Diabetes: No  How often do you need to have someone help you when you read instructions, pamphlets, or other written materials from your doctor or pharmacy?: 1 - Never What is the last grade level you completed in school?: HSG  Diabetic? No  Interpreter Needed?: No  Information entered by :: Lisa Cassette, Lisa [name].   Activities of Daily Living    07/26/2022    4:23 PM 07/22/2022   12:04 PM  In your present state of health, do you have any difficulty performing the following activities:  Hearing? 0 0  Vision? 0 0  Difficulty concentrating or making decisions? 1 1  Walking or climbing stairs? 1 1  Dressing or bathing? 1 1  Doing errands, shopping? 1 1  Preparing Food and eating ? Y Y  Using the Toilet? N N  In the past six months, have you accidently leaked urine? Y Y  Do you have problems with loss of bowel control? N N  Managing your Medications? N N  Managing your Finances? N N  Housekeeping or managing your Housekeeping? Malvin Johns    Patient Care Team: Pincus Sanes, MD as PCP - General (Internal Medicine) Berkley Harvey, MD as Consulting Physician (Ophthalmology)  Indicate any recent Medical Services you may have received from other than [facility] providers in the past year (date may  be approximate).     Assessment:   This is a routine wellness examination for Lisa [name].  Hearing/Vision screen Hearing Screening - Comments:: Denies hearing difficulties   Vision Screening - Comments:: Wears rx glasses - up to date with routine eye exams with    Dietary issues and exercise activities discussed: Current Exercise Habits: The patient does not participate in regular exercise at present, Exercise limited by: neurologic condition(s)   Goals Addressed   None   Depression Screen    07/26/2022    4:20 PM 07/06/2021   10:09 AM 05/01/2021    9:29 AM 04/29/2020    4:51 PM 04/26/2019    9:05 AM 04/24/2018   11:02 AM 04/22/2017    9:06 AM  PHQ 2/9 Scores  PHQ - 2 Score 0 2 2 1  0 0 0  PHQ- 9 Score 0 2 6 6        Fall Risk    07/26/2022    4:22 PM 07/22/2022   12:04 PM 07/06/2021   10:05 AM 05/01/2021    9:29 AM 04/29/2020    9:08 AM  Fall Risk   Falls in the past year? 0 0 0 0 0  Number falls in past yr: 0 0 0  0  Injury with Fall? 0 0 0  0  Risk for fall due to : No Fall Risks  No Fall Risks  No Fall Risks  Follow up Falls prevention discussed  Falls evaluation completed  Falls evaluation completed    FALL RISK PREVENTION PERTAINING TO THE HOME:  Any stairs in or around the home? Yes  If so, are there any without handrails? No  Home free of loose throw rugs in walkways, pet beds, electrical cords, etc? Yes  Adequate lighting in your home to reduce risk of falls? Yes   ASSISTIVE  DEVICES UTILIZED TO PREVENT FALLS:  Life alert? No  Use of a cane, walker or w/c? Yes  Grab bars in the bathroom? Yes  Shower chair or bench in shower? Yes  Elevated toilet seat or a handicapped toilet? Yes   TIMED UP AND GO:  Was the test performed? No . Telephonic Visit  Cognitive Function:        07/26/2022    4:27 PM 07/06/2021   10:16 AM  6CIT Screen  What Year? 0 points 0 points  What month? 0 points 0 points  What time? 0 points 0 points  Count back from 20 0 points 0 points   Months in reverse 0 points 0 points  Repeat phrase 0 points 0 points  Total Score 0 points 0 points    Immunizations Immunization History  Administered Date(s) Administered   Influenza-Unspecified 01/07/2021, 01/14/2022   Moderna Sars-Covid-2 Vaccination 06/08/2019, 07/10/2019, 01/18/2020   PPD Test 11/08/2011   Pneumococcal Conjugate-13 04/29/2020   Pneumococcal Polysaccharide-23 05/01/2021   Zoster, Live 05/22/2015    TDAP status: Due, Education has been provided regarding the importance of this vaccine. Advised may receive this vaccine at local pharmacy or Health Dept. Aware to provide a copy of the vaccination record if obtained from local pharmacy or Health Dept. Verbalized acceptance and understanding.  Flu Vaccine status: Up to date  Pneumococcal vaccine status: Up to date  Covid-19 vaccine status: Completed vaccines  Qualifies for Shingles Vaccine? Yes   Zostavax completed Yes   Shingrix Completed?: No.    Education has been provided regarding the importance of this vaccine. Patient has been advised to call insurance company to determine out of pocket expense if they have not yet received this vaccine. Advised may also receive vaccine at local pharmacy or Health Dept. Verbalized acceptance and understanding.  Screening Tests Health Maintenance  Topic Date Due   DTaP/Tdap/Td (1 - Tdap) Never done   COVID-19 Vaccine (4 - 2023-24 season) 11/20/2021   Zoster Vaccines- Shingrix (1 of 2) 08/02/2022 (Originally 03/16/2005)   DEXA SCAN  05/05/2023 (Originally 03/16/2020)   INFLUENZA VACCINE  10/21/2022   Medicare Annual Wellness (AWV)  07/26/2023   MAMMOGRAM  11/12/2023   Fecal DNA (Cologuard)  12/01/2024   Pneumonia Vaccine 44+ Years old  Completed   Hepatitis C Screening  Completed   HPV VACCINES  Aged Out   COLONOSCOPY (Pts 45-21yrs Insurance coverage will need to be confirmed)  Discontinued    Health Maintenance  Health Maintenance Due  Topic Date Due    DTaP/Tdap/Td (1 - Tdap) Never done   COVID-19 Vaccine (4 - 2023-24 season) 11/20/2021    Colorectal cancer screening: Type of screening: Cologuard. Completed 12/01/2021. Repeat every 3 years  Mammogram status: Completed 11/11/2021. Repeat every year  Bone Density status: Never done  Lung Cancer Screening: (Low Dose CT Chest recommended if Age 54-80 years, 30 pack-year currently smoking OR have quit w/in 15years.) does not qualify.   Lung Cancer Screening Referral: No  Additional Screening:  Hepatitis C Screening: does qualify; Completed 04/22/2017  Vision Screening: Recommended annual ophthalmology exams for early detection of glaucoma and other disorders of the eye. Is the patient up to date with their annual eye exam?  Yes  Who is the provider or what is the name of the office in which the patient attends annual eye exams? Dr. Arlyss Repress If pt is not established with a provider, would they like to be referred to a provider to establish care? No .  Dental Screening: Recommended annual dental exams for proper oral hygiene  Community Resource Referral / Chronic Care Management: CRR required this visit?  No   CCM required this visit?  No      Plan:     I have personally reviewed and noted the following in the patient's chart:   Medical and social history Use of alcohol, tobacco or illicit drugs  Current medications and supplements including opioid prescriptions. Patient is not currently taking opioid prescriptions. Functional ability and status Nutritional status Physical activity Advanced directives List of other physicians Hospitalizations, surgeries, and ER visits in previous 12 months Vitals Screenings to include cognitive, depression, and falls Referrals and appointments  In addition, I have reviewed and discussed with patient certain preventive protocols, quality metrics, and best practice recommendations. A written personalized care plan for preventive services  as well as general preventive health recommendations were provided to patient.     Mickeal Needy, Lisa [name]   11/25/452   Nurse Notes:  Normal cognitive status assessed by direct observation via telephone conversation by this Nurse Health Advisor. No abnormalities found.

## 2022-07-26 NOTE — Patient Instructions (Addendum)
[name] , Thank you for taking time to come for your Medicare Wellness Visit. I appreciate your ongoing commitment to your health goals. Please review the following plan we discussed and let me know if I can assist you in the future.   These are the goals we discussed:  Goals      Patient declined health goal at this time.        This is a list of the screening recommended for you and due dates:  Health Maintenance  Topic Date Due   DTaP/Tdap/Td vaccine (1 - Tdap) Never done   COVID-19 Vaccine (4 - 2023-24 season) 11/20/2021   Zoster (Shingles) Vaccine (1 of 2) 08/02/2022*   DEXA scan (bone density measurement)  05/05/2023*   Flu Shot  10/21/2022   Medicare Annual Wellness Visit  07/26/2023   Mammogram  11/12/2023   Cologuard (Stool DNA test)  12/01/2024   Pneumonia Vaccine  Completed   Hepatitis C Screening: USPSTF Recommendation to screen - Ages 18-79 yo.  Completed   HPV Vaccine  Aged Out   Colon Cancer Screening  Discontinued  *Topic was postponed. The date shown is not the original due date.    Advanced directives: Yes  Conditions/risks identified: Yes  Next appointment: Follow up in one year for your annual wellness visit.   Preventive Care 87 Years and Older, Female Preventive care refers to lifestyle choices and visits with your health care provider that can promote health and wellness. What does preventive care include? A yearly physical exam. This is also called an annual well check. Dental exams once or twice a year. Routine eye exams. Ask your health care provider how often you should have your eyes checked. Personal lifestyle choices, including: Daily care of your teeth and gums. Regular physical activity. Eating a healthy diet. Avoiding tobacco and drug use. Limiting alcohol use. Practicing safe sex. Taking low-dose aspirin every day. Taking vitamin and mineral supplements as recommended by your health care provider. What happens during an annual  well check? The services and screenings done by your health care provider during your annual well check will depend on your age, overall health, lifestyle risk factors, and family history of disease. Counseling  Your health care provider may ask you questions about your: Alcohol use. Tobacco use. Drug use. Emotional well-being. Home and relationship well-being. Sexual activity. Eating habits. History of falls. Memory and ability to understand (cognition). Work and work Astronomer. Reproductive health. Screening  You may have the following tests or measurements: Height, weight, and BMI. Blood pressure. Lipid and cholesterol levels. These may be checked every 5 years, or more frequently if you are over 17 years old. Skin check. Lung cancer screening. You may have this screening every year starting at age 46 if you have a 30-pack-year history of smoking and currently smoke or have quit within the past 15 years. Fecal occult blood test (FOBT) of the stool. You may have this test every year starting at age 42. Flexible sigmoidoscopy or colonoscopy. You may have a sigmoidoscopy every 5 years or a colonoscopy every 10 years starting at age 14. Hepatitis C blood test. Hepatitis B blood test. Sexually transmitted disease (STD) testing. Diabetes screening. This is done by checking your blood sugar (glucose) after you have not eaten for a while (fasting). You may have this done every 1-3 years. Bone density scan. This is done to screen for osteoporosis. You may have this done starting at age 67. Mammogram. This may be done every  1-2 years. Talk to your health care provider about how often you should have regular mammograms. Talk with your health care provider about your test results, treatment options, and if necessary, the need for more tests. Vaccines  Your health care provider may recommend certain vaccines, such as: Influenza vaccine. This is recommended every year. Tetanus, diphtheria,  and acellular pertussis (Tdap, Td) vaccine. You may need a Td booster every 10 years. Zoster vaccine. You may need this after age 37. Pneumococcal 13-valent conjugate (PCV13) vaccine. One dose is recommended after age 54. Pneumococcal polysaccharide (PPSV23) vaccine. One dose is recommended after age 17. Talk to your health care provider about which screenings and vaccines you need and how often you need them. This information is not intended to replace advice given to you by your health care provider. Make sure you discuss any questions you have with your health care provider. Document Released: 04/04/2015 Document Revised: 11/26/2015 Document Reviewed: 01/07/2015 Elsevier Interactive Patient Education  2017 [place] Prevention in the Home Falls can cause injuries. They can happen to people of all ages. There are many things you can do to make your home safe and to help prevent falls. What can I do on the outside of my home? Regularly fix the edges of walkways and driveways and fix any cracks. Remove anything that might make you trip as you walk through a door, such as a raised step or threshold. Trim any bushes or trees on the path to your home. Use bright outdoor lighting. Clear any walking paths of anything that might make someone trip, such as rocks or tools. Regularly check to see if handrails are loose or broken. Make sure that both sides of any steps have handrails. Any raised decks and porches should have guardrails on the edges. Have any leaves, snow, or ice cleared regularly. Use sand or salt on walking paths during winter. Clean up any spills in your garage right away. This includes oil or grease spills. What can I do in the bathroom? Use night lights. Install grab bars by the toilet and in the tub and shower. Do not use towel bars as grab bars. Use non-skid mats or decals in the tub or shower. If you need to sit down in the shower, use a plastic, non-slip  stool. Keep the floor dry. Clean up any water that spills on the floor as soon as it happens. Remove soap buildup in the tub or shower regularly. Attach bath mats securely with double-sided non-slip rug tape. Do not have throw rugs and other things on the floor that can make you trip. What can I do in the bedroom? Use night lights. Make sure that you have a light by your bed that is easy to reach. Do not use any sheets or blankets that are too big for your bed. They should not hang down onto the floor. Have a firm chair that has side arms. You can use this for support while you get dressed. Do not have throw rugs and other things on the floor that can make you trip. What can I do in the kitchen? Clean up any spills right away. Avoid walking on wet floors. Keep items that you use a lot in easy-to-reach places. If you need to reach something above you, use a strong step stool that has a grab bar. Keep electrical cords out of the way. Do not use floor polish or wax that makes floors slippery. If you must use wax, use non-skid  floor wax. Do not have throw rugs and other things on the floor that can make you trip. What can I do with my stairs? Do not leave any items on the stairs. Make sure that there are handrails on both sides of the stairs and use them. Fix handrails that are broken or loose. Make sure that handrails are as long as the stairways. Check any carpeting to make sure that it is firmly attached to the stairs. Fix any carpet that is loose or worn. Avoid having throw rugs at the top or bottom of the stairs. If you do have throw rugs, attach them to the floor with carpet tape. Make sure that you have a light switch at the top of the stairs and the bottom of the stairs. If you do not have them, ask someone to add them for you. What else can I do to help prevent falls? Wear shoes that: Do not have high heels. Have rubber bottoms. Are comfortable and fit you well. Are closed at the  toe. Do not wear sandals. If you use a stepladder: Make sure that it is fully opened. Do not climb a closed stepladder. Make sure that both sides of the stepladder are locked into place. Ask someone to hold it for you, if possible. Clearly mark and make sure that you can see: Any grab bars or handrails. First and last steps. Where the edge of each step is. Use tools that help you move around (mobility aids) if they are needed. These include: Canes. Walkers. Scooters. Crutches. Turn on the lights when you go into a dark area. Replace any light bulbs as soon as they burn out. Set up your furniture so you have a clear path. Avoid moving your furniture around. If any of your floors are uneven, fix them. If there are any pets around you, be aware of where they are. Review your medicines with your doctor. Some medicines can make you feel dizzy. This can increase your chance of falling. Ask your doctor what other things that you can do to help prevent falls. This information is not intended to replace advice given to you by your health care provider. Make sure you discuss any questions you have with your health care provider. Document Released: 01/02/2009 Document Revised: 08/14/2015 Document Reviewed: 04/12/2014 Elsevier Interactive Patient Education  2017 [employer].

## 2023-05-06 ENCOUNTER — Encounter: Payer: Medicare Other | Admitting: Internal Medicine

## 2023-05-09 ENCOUNTER — Encounter: Payer: Self-pay | Admitting: Internal Medicine

## 2023-05-09 NOTE — Progress Notes (Unsigned)
Subjective:    Patient ID: Lisa [name], female    DOB: [birth year], 68 y.o.   MRN: [id number]      HPI Lisa [name] is here for a Physical exam and her chronic medical problems.   Walker inside.  Uses motorized wheelchair outside, scooter inside, outside.    Goes from hot to cold - freezing one sec, then fine, and sometimes then burning up.  Hotness comes with activity.  At rest gets a chill.  She knows some of this could be related to her muscular dystrophy, but had never had the cold flushes before and was unsure what the cause was   Occ can not find the word.  She was concerned about her memory   Medications and allergies reviewed with patient and updated if appropriate.  Current Outpatient Medications on File Prior to Visit  Medication Sig Dispense Refill   aspirin 81 MG EC tablet Take 81 mg by mouth daily. Swallow whole.     celecoxib (CELEBREX) 200 MG capsule Take 200 mg by mouth daily.     ezetimibe (ZETIA) 10 MG tablet Take 1 tablet (10 mg total) by mouth daily. 90 tablet 3   hydrochlorothiazide (HYDRODIURIL) 25 MG tablet TAKE 1 TABLET(25 MG) BY MOUTH DAILY 90 tablet 3   losartan (COZAAR) 100 MG tablet TAKE 1 TABLET(100 MG) BY MOUTH DAILY 90 tablet 3   Magnesium 250 MG TABS Take 250 mg by mouth 2 (two) times daily.     Multiple Vitamin (MULTI VITAMIN DAILY PO) Take by mouth.     Omega-3 Fatty Acids (FISH OIL) 1200 MG CPDR Take 1,200 mg by mouth daily.     traMADol (ULTRAM) 50 MG tablet Take 50 mg by mouth every 6 (six) hours as needed.     Vitamin D, Cholecalciferol, 1000 UNITS TABS Take 2,000 Units by mouth daily. Patient takes Vitamin D 3 times per week     [DISCONTINUED] losartan-hydrochlorothiazide (HYZAAR) 50-12.5 MG tablet Take 1 tablet by mouth daily. 90 tablet 3   No current facility-administered medications on file prior to visit.    Review of Systems  Constitutional:  Negative for fever.       Hot, cold flashes  Eyes:  Negative for visual disturbance.   Respiratory:  Positive for cough (heat and weather related - dry). Negative for shortness of breath and wheezing.   Cardiovascular:  Positive for leg swelling (when legs are down a lot). Negative for chest pain and palpitations.  Gastrointestinal:  Positive for constipation (controlled with colace). Negative for abdominal pain, blood in stool and diarrhea.       No gerd  Genitourinary:  Negative for dysuria.  Musculoskeletal:  Positive for arthralgias and back pain.  Skin:  Negative for rash.  Neurological:  Positive for weakness (left side is weaker) and numbness (occ in right hand). Negative for light-headedness and headaches.  Psychiatric/Behavioral:  Positive for sleep disturbance (wakes frequently - can get back to sleep). Negative for dysphoric mood. The patient is not nervous/anxious.        Objective:   Vitals:   05/10/23 0930  BP: 130/78  Pulse: 74  Temp: 98 F (36.7 C)  SpO2: 96%   There were no vitals filed for this visit. Body mass index is 36.34 kg/m.  BP Readings from Last 3 Encounters:  05/10/23 130/78  05/04/22 134/80  05/01/21 118/82    Wt Readings from Last 3 Encounters:  07/26/22 232 lb (105.2 kg)  05/04/22 232 lb (105.2 kg)  05/01/21 230 lb 4 oz (104.4 kg)       Physical Exam Constitutional: She appears well-developed and well-nourished. No distress.  HENT:  Head: Normocephalic and atraumatic.  Right Ear: External ear normal. Normal ear canal and TM Left Ear: External ear normal.  Normal ear canal and TM Mouth/Throat: Oropharynx is clear and moist.  Eyes: Conjunctivae normal.  Neck: Neck supple. No tracheal deviation present. No thyromegaly present.  No carotid bruit  Cardiovascular: Normal rate, regular rhythm and normal heart sounds.   No murmur heard.  No edema. Pulmonary/Chest: Effort normal and breath sounds normal. No respiratory distress. She has no wheezes. She has no rales.  Breast: deferred   Abdominal: Soft. She exhibits no  distension. There is no tenderness.  Lymphadenopathy: She has no cervical adenopathy.  Skin: Skin is warm and dry. She is not diaphoretic.  Psychiatric: She has a normal mood and affect. Her behavior is normal.     Lab Results  Component Value Date   WBC 7.0 06/30/2022   HGB 14.2 06/30/2022   HCT 42.8 06/30/2022   PLT 262.0 06/30/2022   GLUCOSE 110 (H) 06/30/2022   CHOL 234 (H) 06/30/2022   TRIG 205.0 (H) 06/30/2022   HDL 39.30 06/30/2022   LDLDIRECT 156.0 06/30/2022   LDLCALC 90 10/27/2017   ALT 26 06/30/2022   AST 19 06/30/2022   NA 139 06/30/2022   K 3.9 06/30/2022   CL 101 06/30/2022   CREATININE 0.59 06/30/2022   BUN 15 06/30/2022   CO2 29 06/30/2022   TSH 1.87 06/30/2022   HGBA1C 5.8 06/30/2022         Assessment & Plan:   Physical exam: Screening blood work  ordered Exercise  none Weight  obese Substance abuse  none   Reviewed recommended immunizations.   Health Maintenance  Topic Date Due   DEXA SCAN  Never done   COVID-19 Vaccine (4 - 2024-25 season) 05/26/2023 (Originally 11/21/2022)   Zoster Vaccines- Shingrix (1 of 2) 08/07/2023 (Originally 03/16/2005)   DTaP/Tdap/Td (1 - Tdap) 05/09/2024 (Originally 03/16/1974)   Medicare Annual Wellness (AWV)  07/26/2023   MAMMOGRAM  11/12/2023   Fecal DNA (Cologuard)  12/01/2024   Pneumonia Vaccine 64+ Years old  Completed   INFLUENZA VACCINE  Completed   Hepatitis C Screening  Completed   HPV VACCINES  Aged Out   Colonoscopy  Discontinued      DEXA ordered    See Problem List for Assessment and Plan of chronic medical problems.

## 2023-05-09 NOTE — Patient Instructions (Addendum)
Blood work was ordered.       Medications changes include :   None   Schedule mammogram and bone density.      Return in about 1 year (around 05/09/2024) for Physical Exam.    Health Maintenance, Female Adopting a healthy lifestyle and getting preventive care are important in promoting health and wellness. Ask your health care provider about: The right schedule for you to have regular tests and exams. Things you can do on your own to prevent diseases and keep yourself healthy. What should I know about diet, weight, and exercise? Eat a healthy diet  Eat a diet that includes plenty of vegetables, fruits, low-fat dairy products, and lean protein. Do not eat a lot of foods that are high in solid fats, added sugars, or sodium. Maintain a healthy weight Body mass index (BMI) is used to identify weight problems. It estimates body fat based on height and weight. Your health care provider can help determine your BMI and help you achieve or maintain a healthy weight. Get regular exercise Get regular exercise. This is one of the most important things you can do for your health. Most adults should: Exercise for at least 150 minutes each week. The exercise should increase your heart rate and make you sweat (moderate-intensity exercise). Do strengthening exercises at least twice a week. This is in addition to the moderate-intensity exercise. Spend less time sitting. Even light physical activity can be beneficial. Watch cholesterol and blood lipids Have your blood tested for lipids and cholesterol at 69 years of age, then have this test every 5 years. Have your cholesterol levels checked more often if: Your lipid or cholesterol levels are high. You are older than 69 years of age. You are at high risk for heart disease. What should I know about cancer screening? Depending on your health history and family history, you may need to have cancer screening at various ages. This may include  screening for: Breast cancer. Cervical cancer. Colorectal cancer. Skin cancer. Lung cancer. What should I know about heart disease, diabetes, and high blood pressure? Blood pressure and heart disease High blood pressure causes heart disease and increases the risk of stroke. This is more likely to develop in people who have high blood pressure readings or are overweight. Have your blood pressure checked: Every 3-5 years if you are 33-61 years of age. Every year if you are 33 years old or older. Diabetes Have regular diabetes screenings. This checks your fasting blood sugar level. Have the screening done: Once every three years after age 73 if you are at a normal weight and have a low risk for diabetes. More often and at a younger age if you are overweight or have a high risk for diabetes. What should I know about preventing infection? Hepatitis B If you have a higher risk for hepatitis B, you should be screened for this virus. Talk with your health care provider to find out if you are at risk for hepatitis B infection. Hepatitis C Testing is recommended for: Everyone born from [birth year] through 1966. Anyone with known risk factors for hepatitis C. Sexually transmitted infections (STIs) Get screened for STIs, including gonorrhea and chlamydia, if: You are sexually active and are younger than 69 years of age. You are older than 69 years of age and your health care provider tells you that you are at risk for this type of infection. Your sexual activity has changed since you were last screened, and  you are at increased risk for chlamydia or gonorrhea. Ask your health care provider if you are at risk. Ask your health care provider about whether you are at high risk for HIV. Your health care provider may recommend a prescription medicine to help prevent HIV infection. If you choose to take medicine to prevent HIV, you should first get tested for HIV. You should then be tested every 3 months for as  long as you are taking the medicine. Pregnancy If you are about to stop having your period (premenopausal) and you may become pregnant, seek counseling before you get pregnant. Take 400 to 800 micrograms (mcg) of folic acid every day if you become pregnant. Ask for birth control (contraception) if you want to prevent pregnancy. Osteoporosis and menopause Osteoporosis is a disease in which the bones lose minerals and strength with aging. This can result in bone fractures. If you are 9 years old or older, or if you are at risk for osteoporosis and fractures, ask your health care provider if you should: Be screened for bone loss. Take a calcium or vitamin D supplement to lower your risk of fractures. Be given hormone replacement therapy (HRT) to treat symptoms of menopause. Follow these instructions at home: Alcohol use Do not drink alcohol if: Your health care provider tells you not to drink. You are pregnant, may be pregnant, or are planning to become pregnant. If you drink alcohol: Limit how much you have to: 0-1 drink a day. Know how much alcohol is in your drink. In the [place] one drink equals one 12 oz bottle of beer (355 mL), one 5 oz glass of wine (148 mL), or one 1 oz glass of hard liquor (44 mL). Lifestyle Do not use any products that contain nicotine or tobacco. These products include cigarettes, chewing tobacco, and vaping devices, such as e-cigarettes. If you need help quitting, ask your health care provider. Do not use street drugs. Do not share needles. Ask your health care provider for help if you need support or information about quitting drugs. General instructions Schedule regular health, dental, and eye exams. Stay current with your vaccines. Tell your health care provider if: You often feel depressed. You have ever been abused or do not feel safe at home. Summary Adopting a healthy lifestyle and getting preventive care are important in promoting health and  wellness. Follow your health care provider's instructions about healthy diet, exercising, and getting tested or screened for diseases. Follow your health care provider's instructions on monitoring your cholesterol and blood pressure. This information is not intended to replace advice given to you by your health care provider. Make sure you discuss any questions you have with your health care provider. Document Revised: 07/28/2020 Document Reviewed: 07/28/2020 Elsevier Patient Education  2024 [employer].

## 2023-05-10 ENCOUNTER — Ambulatory Visit (INDEPENDENT_AMBULATORY_CARE_PROVIDER_SITE_OTHER): Payer: Medicare Other | Admitting: Internal Medicine

## 2023-05-10 ENCOUNTER — Other Ambulatory Visit: Payer: Self-pay | Admitting: Internal Medicine

## 2023-05-10 VITALS — BP 130/78 | HR 74 | Temp 98.0°F | Ht 67.0 in

## 2023-05-10 DIAGNOSIS — I6523 Occlusion and stenosis of bilateral carotid arteries: Secondary | ICD-10-CM | POA: Diagnosis not present

## 2023-05-10 DIAGNOSIS — Z13820 Encounter for screening for osteoporosis: Secondary | ICD-10-CM

## 2023-05-10 DIAGNOSIS — E2839 Other primary ovarian failure: Secondary | ICD-10-CM

## 2023-05-10 DIAGNOSIS — R7303 Prediabetes: Secondary | ICD-10-CM

## 2023-05-10 DIAGNOSIS — E559 Vitamin D deficiency, unspecified: Secondary | ICD-10-CM | POA: Diagnosis not present

## 2023-05-10 DIAGNOSIS — E7849 Other hyperlipidemia: Secondary | ICD-10-CM | POA: Diagnosis not present

## 2023-05-10 DIAGNOSIS — I10 Essential (primary) hypertension: Secondary | ICD-10-CM | POA: Diagnosis not present

## 2023-05-10 DIAGNOSIS — R6889 Other general symptoms and signs: Secondary | ICD-10-CM

## 2023-05-10 DIAGNOSIS — Z0000 Encounter for general adult medical examination without abnormal findings: Secondary | ICD-10-CM | POA: Diagnosis not present

## 2023-05-10 DIAGNOSIS — G7102 Facioscapulohumeral muscular dystrophy: Secondary | ICD-10-CM

## 2023-05-10 LAB — LIPID PANEL
Cholesterol: 250 mg/dL — ABNORMAL HIGH (ref 0–200)
HDL: 41.9 mg/dL (ref >39.00)
LDL Cholesterol: 159 mg/dL — ABNORMAL HIGH (ref 0–99)
NonHDL: 208.57
Total CHOL/HDL Ratio: 6
Triglycerides: 246 mg/dL — ABNORMAL HIGH (ref 0.0–149.0)
VLDL: 49.2 mg/dL — ABNORMAL HIGH (ref 0.0–40.0)

## 2023-05-10 LAB — COMPREHENSIVE METABOLIC PANEL
ALT: 28 U/L (ref 0–35)
AST: 22 U/L (ref 0–37)
Albumin: 4.4 g/dL (ref 3.5–5.2)
Alkaline Phosphatase: 85 U/L (ref 39–117)
BUN: 16 mg/dL (ref 6–23)
CO2: 31 meq/L (ref 19–32)
Calcium: 10 mg/dL (ref 8.4–10.5)
Chloride: 98 meq/L (ref 96–112)
Creatinine, Ser: 0.56 mg/dL (ref 0.40–1.20)
GFR: 93.96 mL/min (ref >60.00)
Glucose, Bld: 120 mg/dL — ABNORMAL HIGH (ref 70–99)
Potassium: 4 meq/L (ref 3.5–5.1)
Sodium: 138 meq/L (ref 135–145)
Total Bilirubin: 0.6 mg/dL (ref 0.2–1.2)
Total Protein: 7.6 g/dL (ref 6.0–8.3)

## 2023-05-10 LAB — CBC WITH DIFFERENTIAL/PLATELET
Basophils Absolute: 0 10*3/uL (ref 0.0–0.1)
Basophils Relative: 0.4 % (ref 0.0–3.0)
Eosinophils Absolute: 0.1 10*3/uL (ref 0.0–0.7)
Eosinophils Relative: 1.7 % (ref 0.0–5.0)
HCT: 44.3 % (ref 36.0–46.0)
Hemoglobin: 15 g/dL (ref 12.0–15.0)
Lymphocytes Relative: 22 % (ref 12.0–46.0)
Lymphs Abs: 1.9 10*3/uL (ref 0.7–4.0)
MCHC: 33.8 g/dL (ref 30.0–36.0)
MCV: 90.5 fL (ref 78.0–100.0)
Monocytes Absolute: 0.7 10*3/uL (ref 0.1–1.0)
Monocytes Relative: 8.2 % (ref 3.0–12.0)
Neutro Abs: 5.9 10*3/uL (ref 1.4–7.7)
Neutrophils Relative %: 67.7 % (ref 43.0–77.0)
Platelets: 268 10*3/uL (ref 150.0–400.0)
RBC: 4.9 Mil/uL (ref 3.87–5.11)
RDW: 13.7 % (ref 11.5–15.5)
WBC: 8.8 10*3/uL (ref 4.0–10.5)

## 2023-05-10 LAB — TSH: TSH: 1.56 u[IU]/mL (ref 0.35–5.50)

## 2023-05-10 LAB — HEMOGLOBIN A1C: Hgb A1c MFr Bld: 5.7 % (ref 4.6–6.5)

## 2023-05-10 LAB — VITAMIN D 25 HYDROXY (VIT D DEFICIENCY, FRACTURES): VITD: 45.17 ng/mL (ref 30.00–100.00)

## 2023-05-10 MED ORDER — LOSARTAN POTASSIUM-HCTZ 100-25 MG PO TABS: 1.0000 | ORAL_TABLET | Freq: Every day | ORAL | 3 refills | Status: AC

## 2023-05-10 NOTE — Assessment & Plan Note (Signed)
 Chronic Taking vitamin D daily Check vitamin D level

## 2023-05-10 NOTE — Assessment & Plan Note (Signed)
Chronic Blood pressure well controlled CMP, cbc Continue hydrochlorothiazide 25 mg daily, losartan 100 mg daily

## 2023-05-10 NOTE — Assessment & Plan Note (Signed)
Chronic Muscle weakness has progressed since her last visit mildly She walks around in the house, but does not walk outside the house Always uses walker inside the house Has a electric wheelchair and scooter

## 2023-05-10 NOTE — Assessment & Plan Note (Signed)
Subacute Has complained about this 2 years ago Has symptoms of being hot and cold and temperature varies without obvious cause at times-sometimes gets out of out of proportion for activity ?  Dysautonomia or dys-thermal regulation

## 2023-05-10 NOTE — Assessment & Plan Note (Addendum)
Chronic Healthy diet encouraged Check lipid panel  Stopped pravastatin and should not be on statin due to her muscular dystrophy Continue Zetia 10 mg daily May need to consider additional medication depending on LDL

## 2023-05-10 NOTE — Assessment & Plan Note (Addendum)
Chronic Mild Continue aspirin 81 mg daily Stopped pravastatin and should not be on statin due to her muscular dystrophy Continue Zetia 10 mg daily Check lipid panel-May need to consider another treatment besides Zetia if her LDL is not at goal

## 2023-05-10 NOTE — Assessment & Plan Note (Signed)
Chronic Lab Results  Component Value Date   HGBA1C 5.8 06/30/2022    Check a1c Low sugar / carb diet

## 2023-05-11 ENCOUNTER — Encounter: Payer: Self-pay | Admitting: Internal Medicine

## 2023-05-11 ENCOUNTER — Other Ambulatory Visit: Payer: Self-pay | Admitting: Obstetrics and Gynecology

## 2023-05-11 DIAGNOSIS — Z1231 Encounter for screening mammogram for malignant neoplasm of breast: Secondary | ICD-10-CM

## 2023-05-12 MED ORDER — REPATHA 140 MG/ML ~~LOC~~ SOSY
140.0000 mg | PREFILLED_SYRINGE | SUBCUTANEOUS | 5 refills | Status: DC
Start: 2023-05-12 — End: 2023-10-12

## 2023-05-25 ENCOUNTER — Telehealth: Payer: Self-pay

## 2023-05-25 NOTE — Telephone Encounter (Signed)
 Copied from CRM [id number]. Topic: General - Other >> May 25, 2023  2:41 PM [name] wrote: Reason for CRM: patient is calling in needing for dr burns to call her Insurance company to give a reason why she prescribed Evolocumab (REPATHA) 140 MG/ML SOSY for her

## 2023-05-31 ENCOUNTER — Other Ambulatory Visit (HOSPITAL_COMMUNITY): Payer: Self-pay

## 2023-05-31 ENCOUNTER — Telehealth: Payer: Self-pay

## 2023-05-31 NOTE — Telephone Encounter (Signed)
 Pharmacy Patient Advocate Encounter  Received notification from [employer] that Prior Authorization for Repatha 140mg /ml has been APPROVED from 03/23/23 to 03/21/24   PA #/Case ID/Reference #: [id number]

## 2023-05-31 NOTE — Telephone Encounter (Signed)
My-chart sent to patient today regarding approval.

## 2023-05-31 NOTE — Telephone Encounter (Signed)
 Pharmacy Patient Advocate Encounter   Received notification from Pt Calls Messages that prior authorization for Repatha 140mg /ml is required/requested.   Insurance verification completed.   The patient is insured through [employer] .   Per test claim: PA required; PA submitted to above mentioned insurance via Phone Key/confirmation #/[id number] -- Status is pending

## 2023-06-01 ENCOUNTER — Ambulatory Visit: Payer: Medicare HMO

## 2023-07-20 ENCOUNTER — Ambulatory Visit
Admission: RE | Admit: 2023-07-20 | Discharge: 2023-07-20 | Disposition: A | Discharge status: Home / Self Care | Source: Ambulatory Visit | Attending: Obstetrics and Gynecology | Admitting: Obstetrics and Gynecology

## 2023-07-20 DIAGNOSIS — Z1231 Encounter for screening mammogram for malignant neoplasm of breast: Secondary | ICD-10-CM

## 2023-07-27 ENCOUNTER — Ambulatory Visit: Payer: Medicare Other

## 2023-07-27 VITALS — Ht 67.0 in | Wt 232.0 lb

## 2023-07-27 DIAGNOSIS — Z0000 Encounter for general adult medical examination without abnormal findings: Secondary | ICD-10-CM | POA: Diagnosis not present

## 2023-07-27 NOTE — Progress Notes (Signed)
 Subjective:   Lisa [name] is a 68 y.o. who presents for a Medicare Wellness preventive visit.  Visit Complete: Virtual I connected with  Lisa [name] on 07/27/23 by a audio enabled telemedicine application and verified that I am speaking with the correct person using two identifiers.  Patient Location: Home  Provider Location: Office/Clinic  I discussed the limitations of evaluation and management by telemedicine. The patient expressed understanding and agreed to proceed.  Vital Signs: Because this visit was a virtual/telehealth visit, some criteria may be missing or patient reported. Any vitals not documented were not able to be obtained and vitals that have been documented are patient reported.  VideoDeclined- This patient declined Librarian, academic. Therefore the visit was completed with audio only.  Persons Participating in Visit: Patient.  AWV Questionnaire: Yes: Patient Medicare AWV questionnaire was completed by the patient on 07/22/2023; I have confirmed that all information answered by patient is correct and no changes since this date.  Cardiac Risk Factors include: advanced age (>47men, >26 women);hypertension;dyslipidemia;obesity (BMI >30kg/m2)     Objective:    Today's Vitals   07/27/23 1510  Weight: 232 lb (105.2 kg)  Height: 5\' 7"  (1.702 m)   Body mass index is 36.34 kg/m.     07/27/2023    3:08 PM 07/26/2022    4:21 PM 07/06/2021   10:04 AM 05/04/2016    9:32 AM 04/30/2016    9:31 AM 02/19/2015    1:27 PM  Advanced Directives  Does Patient Have a Medical Advance Directive? Yes Yes Yes No No No  Type of Estate agent of [place];Living will Healthcare Power of [place];Living will Living will;Healthcare Power of Attorney     Does patient want to make changes to medical advance directive?   No - Patient declined     Copy of Healthcare Power of Attorney in Chart? No - copy requested No - copy requested No  - copy requested     Would patient like information on creating a medical advance directive?    No - Patient declined      Current Medications (verified) Outpatient Encounter Medications as of 07/27/2023  Medication Sig   aspirin 81 MG EC tablet Take 81 mg by mouth daily. Swallow whole.   celecoxib (CELEBREX) 200 MG capsule Take 200 mg by mouth daily.   Evolocumab  (REPATHA ) 140 MG/ML SOSY Inject 140 mg into the skin every 14 (fourteen) days.   losartan -hydrochlorothiazide  (HYZAAR) 100-25 MG tablet Take 1 tablet by mouth daily.   Magnesium 250 MG TABS Take 250 mg by mouth 2 (two) times daily.   Multiple Vitamin (MULTI VITAMIN DAILY PO) Take by mouth.   Omega-3 Fatty Acids (FISH OIL) 1200 MG CPDR Take 1,200 mg by mouth daily.   traMADol (ULTRAM) 50 MG tablet Take 50 mg by mouth every 6 (six) hours as needed.   Vitamin D , Cholecalciferol, 1000 UNITS TABS Take 2,000 Units by mouth daily. Patient takes Vitamin D  3 times per week   [DISCONTINUED] ezetimibe  (ZETIA ) 10 MG tablet Take 1 tablet (10 mg total) by mouth daily.   No facility-administered encounter medications on file as of 07/27/2023.    Allergies (verified) Atorvastatin , Crestor  [rosuvastatin  calcium ], Cyclosporine, and Lisinopril    History: Past Medical History:  Diagnosis Date   Cataract    removed from both eyes   GERD (gastroesophageal reflux disease)    no issue while on tums   Heart murmur    Heavy menses  irregular; on Provera   Hyperlipidemia    diet controlled   Hypertension    Muscular dystrophy, facioscapulohumeral (HCC)    Past Surgical History:  Procedure Laterality Date   CATARACT EXTRACTION Bilateral    CESAREAN SECTION     X2   COLONOSCOPY  2008   [facility]   LASER ABLATION CONDYLOMA CERVICAL / VULVAR     LASIK Bilateral    LUMBAR DISC SURGERY  2011   L4-5 ; Dr Mevelyn Acton   POLYPECTOMY     VITRECTOMY Left 01-2015   Family History  Problem Relation Age of Onset   Thyroid  cancer Mother     Hypertension Mother    Heart murmur Mother 33       unknown valve replaced   Colon polyps Father    Hyperlipidemia Father    Atrial fibrillation Father        has PPM   CAD Father 71       stent placed   Diabetes Paternal Uncle    Diabetes Paternal Grandmother    Heart attack Paternal Grandmother        <65   Stroke Paternal Grandfather    Hypertension Sister    Hyperlipidemia Sister    Muscular dystrophy Cousin    Colon cancer Neg Hx    Esophageal cancer Neg Hx    Rectal cancer Neg Hx    Stomach cancer Neg Hx    Social History   Socioeconomic History   Marital status: Married    Spouse name: Not on file   Number of children: 2   Years of education: 16   Highest education level: Bachelor's degree (e.g., BA, AB, BS)  Occupational History   Occupation: retired Manufacturing systems engineer  Tobacco Use   Smoking status: Never    Passive exposure: Never   Smokeless tobacco: Never  Vaping Use   Vaping status: Never Used  Substance and Sexual Activity   Alcohol use: No    Alcohol/week: 0.0 standard drinks of alcohol   Drug use: No   Sexual activity: Not on file  Other Topics Concern   Not on file  Social History Narrative   ** Merged History Encounter **       Lives with husband in a 2 story home.  Has 2 children and 3 grandchildren.  Retired Manufacturing systems engineer.  Education: college degree.    Social Drivers of Corporate investment banker Strain: Low Risk  (07/27/2023)   Overall Financial Resource Strain (CARDIA)    Difficulty of Paying Living Expenses: Not hard at all  Food Insecurity: No Food Insecurity (07/27/2023)   Hunger Vital Sign    Worried About Running Out of Food in the Last Year: Never true    Ran Out of Food in the Last Year: Never true  Transportation Needs: No Transportation Needs (07/27/2023)   PRAPARE - Administrator, Civil Service (Medical): No    Lack of Transportation (Non-Medical): No  Physical Activity: Inactive (07/27/2023)   Exercise Vital Sign     Days of Exercise per Week: 0 days    Minutes of Exercise per Session: 0 min  Stress: No Stress Concern Present (07/27/2023)   [employer] of Occupational Health - Occupational Stress Questionnaire    Feeling of Stress : Only a little  Social Connections: Socially Integrated (07/27/2023)   Social Connection and Isolation Panel [NHANES]    Frequency of Communication with Friends and Family: More than three times a week  Frequency of Social Gatherings with Friends and Family: Twice a week    Attends Religious Services: More than 4 times per year    Active Member of [employer] or Organizations: Yes    Attends Engineer, structural: More than 4 times per year    Marital Status: Married    Tobacco Counseling Counseling given: No    Clinical Intake:  Pre-visit preparation completed: Yes  Pain : No/denies pain     BMI - recorded: 36.34 Nutritional Status: BMI > 30  Obese Nutritional Risks: None Diabetes: No  Lab Results  Component Value Date   HGBA1C 5.7 05/10/2023   HGBA1C 5.8 06/30/2022   HGBA1C 5.7 05/01/2021     How often do you need to have someone help you when you read instructions, pamphlets, or other written materials from your doctor or pharmacy?: 1 - Never  Interpreter Needed?: No  Information entered by :: Kandy Orris, CMA   Activities of Daily Living     07/27/2023    3:13 PM 07/22/2023   11:27 AM  In your present state of health, do you have any difficulty performing the following activities:  Hearing? 0 0  Vision? 0 0  Difficulty concentrating or making decisions? 1 1  Walking or climbing stairs? 1 1  Dressing or bathing? 1 1  Comment Spouse assists   Doing errands, shopping? 1 1  Preparing Food and eating ? Y Y  Using the Toilet? Y Y  In the past six months, have you accidently leaked urine? Y Y  Do you have problems with loss of bowel control? N N  Managing your Medications? N N  Managing your Finances? N N  Housekeeping or managing  your Housekeeping? Colie Dawes    Patient Care Team: Colene Dauphin, MD as PCP - General (Internal Medicine) Gerhard Knuckles, MD as Consulting Physician (Ophthalmology)  Indicate any recent Medical Services you may have received from other than [facility] providers in the past year (date may be approximate).     Assessment:   This is a routine wellness examination for [place].  Hearing/Vision screen Hearing Screening - Comments:: Denies hearing difficulties   Vision Screening - Comments:: Wears rx glasses - up to date with routine eye exams with Dr Erminio Hazy   Goals Addressed               This Visit's Progress     Patient Stated (pt-stated)        Patient stated she wants to stay positive due to muscular dystrophy.       Depression Screen     07/27/2023    3:17 PM 05/10/2023    9:34 AM 07/26/2022    4:20 PM 07/06/2021   10:09 AM 05/01/2021    9:29 AM 04/29/2020    4:51 PM 04/26/2019    9:05 AM  PHQ 2/9 Scores  PHQ - 2 Score 0 0 0 2 2 1  0  PHQ- 9 Score 4  0 2 6 6      Fall Risk     07/27/2023    3:14 PM 07/22/2023   11:27 AM 05/10/2023    9:34 AM 07/26/2022    4:22 PM 07/22/2022   12:04 PM  Fall Risk   Falls in the past year? 0 0 0 0 0  Number falls in past yr: 0 0 0 0 0  Injury with Fall? 0 0 0 0 0  Risk for fall due to : No Fall Risks  Impaired  mobility No Fall Risks   Follow up Falls prevention discussed;Falls evaluation completed  Falls evaluation completed Falls prevention discussed     MEDICARE RISK AT HOME:  Medicare Risk at Home Any stairs in or around the home?: Yes If so, are there any without handrails?: No Home free of loose throw rugs in walkways, pet beds, electrical cords, etc?: Yes Adequate lighting in your home to reduce risk of falls?: Yes Life alert?: No Use of a cane, walker or w/c?: Yes Grab bars in the bathroom?: No Shower chair or bench in shower?: Yes Elevated toilet seat or a handicapped toilet?: Yes  TIMED UP AND GO:  Was the test performed?   No  Cognitive Function: 6CIT completed        07/27/2023    3:19 PM 07/26/2022    4:27 PM 07/06/2021   10:16 AM  6CIT Screen  What Year? 0 points 0 points 0 points  What month? 0 points 0 points 0 points  What time? 0 points 0 points 0 points  Count back from 20 0 points 0 points 0 points  Months in reverse 0 points 0 points 0 points  Repeat phrase 0 points 0 points 0 points  Total Score 0 points 0 points 0 points    Immunizations Immunization History  Administered Date(s) Administered   Fluad Quad(high Dose 65+) 01/06/2023   Influenza-Unspecified 01/07/2021, 01/14/2022   Moderna Sars-Covid-2 Vaccination 06/08/2019, 07/10/2019, 01/18/2020   PPD Test 11/08/2011   Pneumococcal Conjugate-13 04/29/2020   Pneumococcal Polysaccharide-23 05/01/2021   Zoster, Live 05/22/2015    Screening Tests Health Maintenance  Topic Date Due   DEXA SCAN  Never done   COVID-19 Vaccine (4 - 2024-25 season) 11/21/2022   Zoster Vaccines- Shingrix (1 of 2) 08/07/2023 (Originally 03/16/2005)   DTaP/Tdap/Td (1 - Tdap) 05/09/2024 (Originally [birth year]/16/1974)   INFLUENZA VACCINE  10/21/2023   Medicare Annual Wellness (AWV)  07/26/2024   Fecal DNA (Cologuard)  12/01/2024   MAMMOGRAM  07/19/2025   Pneumonia Vaccine 83+ Years old  Completed   Hepatitis C Screening  Completed   HPV VACCINES  Aged Out   Meningococcal B Vaccine  Aged Out   Colonoscopy  Discontinued    Health Maintenance  Health Maintenance Due  Topic Date Due   DEXA SCAN  Never done   COVID-19 Vaccine (4 - 2024-25 season) 11/21/2022   Health Maintenance Items Addressed:  DEXA scheduled for 12/2023 per patient stated.  Additional Screening:  Vision Screening: Recommended annual ophthalmology exams for early detection of glaucoma and other disorders of the eye.  Dental Screening: Recommended annual dental exams for proper oral hygiene  Community Resource Referral / Chronic Care Management: CRR required this visit?  No   CCM  required this visit?  No     Plan:     I have personally reviewed and noted the following in the patient's chart:   Medical and social history Use of alcohol, tobacco or illicit drugs  Current medications and supplements including opioid prescriptions. Patient is currently taking opioid prescriptions. Information provided to patient regarding non-opioid alternatives. Patient advised to discuss non-opioid treatment plan with their provider. Functional ability and status Nutritional status Physical activity Advanced directives List of other physicians Hospitalizations, surgeries, and ER visits in previous 12 months Vitals Screenings to include cognitive, depression, and falls Referrals and appointments  In addition, I have reviewed and discussed with patient certain preventive protocols, quality metrics, and best practice recommendations. A written personalized care plan for preventive services  as well as general preventive health recommendations were provided to patient.     Patria Bookbinder, CMA   07/27/2023   After Visit Summary: (MyChart) Due to this being a telephonic visit, the after visit summary with patients personalized plan was offered to patient via MyChart   Notes: Nothing significant to report at this time.

## 2023-07-27 NOTE — Patient Instructions (Addendum)
 Ms. Krupicka , Thank you for taking time to come for your Medicare Wellness Visit. I appreciate your ongoing commitment to your health goals. Please review the following plan we discussed and let me know if I can assist you in the future.   Referrals/Orders/Follow-Ups/Clinician Recommendations: Aim for 30 minutes of exercise or brisk walking, 6-8 glasses of water, and 5 servings of fruits and vegetables each day. DEXA Scan is scheduled for 12/2023.   This is a list of the screening recommended for you and due dates:  Health Maintenance  Topic Date Due   DEXA scan (bone density measurement)  Never done   COVID-19 Vaccine (4 - 2024-25 season) 11/21/2022   Zoster (Shingles) Vaccine (1 of 2) 08/07/2023*   DTaP/Tdap/Td vaccine (1 - Tdap) 05/09/2024*   Flu Shot  10/21/2023   Medicare Annual Wellness Visit  07/26/2024   Cologuard (Stool DNA test)  12/01/2024   Mammogram  07/19/2025   Pneumonia Vaccine  Completed   Hepatitis C Screening  Completed   HPV Vaccine  Aged Out   Meningitis B Vaccine  Aged Out   Colon Cancer Screening  Discontinued  *Topic was postponed. The date shown is not the original due date.    Advanced directives: (Copy Requested) Please bring a copy of your health care power of attorney and living will to the office to be added to your chart at your convenience. You can mail to [facility] [address] [place], [place] [address] or email to [email or web address]  Next Medicare Annual Wellness Visit scheduled for next year: Yes

## 2023-08-01 DIAGNOSIS — H43811 Vitreous degeneration, right eye: Secondary | ICD-10-CM | POA: Diagnosis not present

## 2023-08-01 DIAGNOSIS — H524 Presbyopia: Secondary | ICD-10-CM | POA: Diagnosis not present

## 2023-08-01 DIAGNOSIS — Z961 Presence of intraocular lens: Secondary | ICD-10-CM | POA: Diagnosis not present

## 2023-08-01 DIAGNOSIS — H16223 Keratoconjunctivitis sicca, not specified as Sjogren's, bilateral: Secondary | ICD-10-CM | POA: Diagnosis not present

## 2023-08-01 DIAGNOSIS — H52203 Unspecified astigmatism, bilateral: Secondary | ICD-10-CM | POA: Diagnosis not present

## 2023-08-01 DIAGNOSIS — H5202 Hypermetropia, left eye: Secondary | ICD-10-CM | POA: Diagnosis not present

## 2023-08-01 DIAGNOSIS — H35373 Puckering of macula, bilateral: Secondary | ICD-10-CM | POA: Diagnosis not present

## 2023-08-01 DIAGNOSIS — H5211 Myopia, right eye: Secondary | ICD-10-CM | POA: Diagnosis not present

## 2023-08-17 DIAGNOSIS — Z01419 Encounter for gynecological examination (general) (routine) without abnormal findings: Secondary | ICD-10-CM | POA: Diagnosis not present

## 2023-08-17 DIAGNOSIS — G7100 Muscular dystrophy, unspecified: Secondary | ICD-10-CM | POA: Diagnosis not present

## 2023-08-17 DIAGNOSIS — Z124 Encounter for screening for malignant neoplasm of cervix: Secondary | ICD-10-CM | POA: Diagnosis not present

## 2023-08-31 DIAGNOSIS — M47816 Spondylosis without myelopathy or radiculopathy, lumbar region: Secondary | ICD-10-CM | POA: Diagnosis not present

## 2023-08-31 DIAGNOSIS — Z6836 Body mass index (BMI) 36.0-36.9, adult: Secondary | ICD-10-CM | POA: Diagnosis not present

## 2023-08-31 DIAGNOSIS — M5416 Radiculopathy, lumbar region: Secondary | ICD-10-CM | POA: Diagnosis not present

## 2023-08-31 DIAGNOSIS — G7100 Muscular dystrophy, unspecified: Secondary | ICD-10-CM | POA: Diagnosis not present

## 2023-10-12 ENCOUNTER — Other Ambulatory Visit: Payer: Self-pay | Admitting: Internal Medicine

## 2023-11-25 ENCOUNTER — Encounter: Payer: Self-pay | Admitting: Internal Medicine

## 2023-11-29 ENCOUNTER — Telehealth: Payer: Self-pay | Admitting: Radiology

## 2023-11-29 MED ORDER — REPATHA SURECLICK 140 MG/ML ~~LOC~~ SOAJ: 140.0000 mg | SUBCUTANEOUS | 3 refills | Status: AC

## 2023-11-29 NOTE — Telephone Encounter (Signed)
 Copied from CRM [id number]. Topic: General - Other >> Nov 29, 2023  2:31 PM Jasmin G wrote: Reason for CRM: Pt called to check on the status of her message request on 9/5, I informed her of the current status and requested a phone call back at [phone] from one of Dr. Jinny nurses to get status since her request involves a prescription change.

## 2024-01-05 ENCOUNTER — Other Ambulatory Visit: Payer: Medicare HMO

## 2024-02-06 ENCOUNTER — Ambulatory Visit (HOSPITAL_BASED_OUTPATIENT_CLINIC_OR_DEPARTMENT_OTHER)
Admission: RE | Admit: 2024-02-06 | Discharge: 2024-02-06 | Disposition: A | Discharge status: Home / Self Care | Source: Ambulatory Visit | Attending: Internal Medicine | Admitting: Internal Medicine

## 2024-02-06 DIAGNOSIS — Z13820 Encounter for screening for osteoporosis: Secondary | ICD-10-CM | POA: Insufficient documentation

## 2024-02-06 DIAGNOSIS — E2839 Other primary ovarian failure: Secondary | ICD-10-CM | POA: Diagnosis not present

## 2024-02-06 DIAGNOSIS — Z780 Asymptomatic menopausal state: Secondary | ICD-10-CM | POA: Diagnosis not present

## 2024-02-12 ENCOUNTER — Ambulatory Visit: Payer: Self-pay | Admitting: Internal Medicine

## 2024-02-12 DIAGNOSIS — M8580 Other specified disorders of bone density and structure, unspecified site: Secondary | ICD-10-CM | POA: Insufficient documentation

## 2024-02-12 DIAGNOSIS — M8588 Other specified disorders of bone density and structure, other site: Secondary | ICD-10-CM

## 2024-02-22 DIAGNOSIS — G7100 Muscular dystrophy, unspecified: Secondary | ICD-10-CM | POA: Diagnosis not present

## 2024-02-22 DIAGNOSIS — M47816 Spondylosis without myelopathy or radiculopathy, lumbar region: Secondary | ICD-10-CM | POA: Diagnosis not present

## 2024-05-11 ENCOUNTER — Encounter: Payer: Medicare HMO | Admitting: Internal Medicine

## 2024-07-27 ENCOUNTER — Ambulatory Visit
